# Patient Record
Sex: Male | Born: 1975 | Race: Black or African American | Hispanic: No | Marital: Married | Smoking: Former smoker
Health system: Southern US, Community
[De-identification: ages and names within clinical notes are randomized; demographics above are authoritative.]

## PROBLEM LIST (undated history)

## (undated) DIAGNOSIS — F419 Anxiety disorder, unspecified: Secondary | ICD-10-CM

## (undated) DIAGNOSIS — M25561 Pain in right knee: Secondary | ICD-10-CM

## (undated) DIAGNOSIS — R079 Chest pain, unspecified: Secondary | ICD-10-CM

## (undated) DIAGNOSIS — R002 Palpitations: Secondary | ICD-10-CM

## (undated) DIAGNOSIS — J383 Other diseases of vocal cords: Secondary | ICD-10-CM

## (undated) DIAGNOSIS — Z8679 Personal history of other diseases of the circulatory system: Secondary | ICD-10-CM

## (undated) HISTORY — PX: PATELLA FRACTURE SURGERY: SHX735

## (undated) HISTORY — DX: Chest pain, unspecified: R07.9

## (undated) HISTORY — DX: Palpitations: R00.2

## (undated) HISTORY — DX: Pain in right knee: M25.561

---

## 2017-08-17 ENCOUNTER — Ambulatory Visit: Payer: 59 | Admitting: Family Medicine

## 2017-08-17 ENCOUNTER — Encounter: Payer: Self-pay | Admitting: Family Medicine

## 2017-08-17 ENCOUNTER — Ambulatory Visit (HOSPITAL_BASED_OUTPATIENT_CLINIC_OR_DEPARTMENT_OTHER)
Admission: RE | Admit: 2017-08-17 | Discharge: 2017-08-17 | Disposition: A | Discharge status: Home / Self Care | Payer: 59 | Source: Ambulatory Visit | Attending: Family Medicine | Admitting: Family Medicine

## 2017-08-17 VITALS — BP 157/103 | HR 72 | Ht 72.0 in | Wt 191.0 lb

## 2017-08-17 DIAGNOSIS — M25461 Effusion, right knee: Secondary | ICD-10-CM | POA: Insufficient documentation

## 2017-08-17 DIAGNOSIS — M25561 Pain in right knee: Secondary | ICD-10-CM | POA: Diagnosis not present

## 2017-08-17 NOTE — Patient Instructions (Signed)
You have quad/patellar tendinitis but you also have a seroma from the wire irritating the local soft tissue. Ice area 15 minutes at a time 3-4 times a day as needed. Aleve 2 tabs twice a day with food OR ibuprofen 600mg  three times a day with food for pain and inflammation as needed. Straight leg raises, knee extensions, straight leg raises with foot turned outwards, decline squats 3 sets of 10 once a day. Consider patellar tendon strap. Consider physical therapy. Consider nitro patches if not improving, orthopedic surgery consult.   Follow up with me in 6 weeks.

## 2017-08-21 ENCOUNTER — Encounter: Payer: Self-pay | Admitting: Family Medicine

## 2017-08-21 DIAGNOSIS — S82001A Unspecified fracture of right patella, initial encounter for closed fracture: Secondary | ICD-10-CM | POA: Insufficient documentation

## 2017-08-21 DIAGNOSIS — M25561 Pain in right knee: Secondary | ICD-10-CM

## 2017-08-21 NOTE — Assessment & Plan Note (Signed)
independently reviewed radiographs and fracture has healed, no loosening of wires.  Brief MSK u/s shows a small seroma with part of wire 2mm deep to skin, tender on ultrasound of this area.  No abnormalities of patellar or quad tendons.  No effusion which is reassuring.  Advised we start with conservative treatment for quad, patellar tendinitis.  Icing, aleve or ibuprofen.  Shown home exercises to do daily.  Consider patellar tendon strap, physical therapy, nitro patches.  F/u in 6 weeks.

## 2017-08-21 NOTE — Progress Notes (Signed)
PCP: Patient, No Pcp Per  Subjective:   HPI: Patient is a 42 y.o. male here for right knee pain.  Patient reports in January 2018 he was skating when he went to fast and hit a concrete pillar leading him to fracture his right patella. He had an ORIF with wiring and overall did well following this. More recently he has had some localized swelling and discomfort anterior knee. Feels he also has pain just below and above the kneecap. 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 He does feel tension and some pain up to 3-4 out of 10 level that can be stinging. No new skin changes, numbness. No new injuries.  No past medical history on file.  No current outpatient medications on file prior to visit.   No current facility-administered medications on file prior to visit.     No Known Allergies  Social History   Socioeconomic History  . Marital status: Married    Spouse name: Not on file  . Number of children: Not on file  . Years of education: Not on file  . Highest education level: Not on file  Occupational History  . Not on file  Social Needs  . Financial resource strain: Not on file  . Food insecurity:    Worry: Not on file    Inability: Not on file  . Transportation needs:    Medical: Not on file    Non-medical: Not on file  Tobacco Use  . Smoking status: Never Smoker  . Smokeless tobacco: Never Used  Substance and Sexual Activity  . Alcohol use: Not on file  . Drug use: Not on file  . Sexual activity: Not on file  Lifestyle  . Physical activity:    Days per week: Not on file    Minutes per session: Not on file  . Stress: Not on file  Relationships  . Social connections:    Talks on phone: Not on file    Gets together: Not on file    Attends religious service: Not on file    Active member of club or organization: Not on file    Attends meetings of clubs or organizations: Not on file    Relationship status: Not on file  . Intimate partner  violence:    Fear of current or ex partner: Not on file    Emotionally abused: Not on file    Physically abused: Not on file    Forced sexual activity: Not on file  Other Topics Concern  . Not on file  Social History Narrative  . Not on file    No family history on file.  BP (!) 157/103   Pulse 72   Ht 6' (1.829 m)   Wt 191 lb (86.6 kg)   BMI 25.90 kg/m   Review of Systems: See HPI above.     Objective:  Physical Exam:  Gen: NAD, comfortable in exam room  Right knee: Well healed surgical scar anteriorly over patella.  Small focal mobile mass felt superolaterally over patella with palpable wire distal to this.  No effusion.  No other gross deformity, ecchymoses, swelling. TTP over mass noted above but mild.  Mild TTP patellar and quad tendons.  No joint line, other tenderness. FROM with 5/5 strength, mild pain with extension. Negative ant/post drawers. Negative valgus/varus testing. Negative lachmanns. Negative mcmurrays, apleys, patellar apprehension. NV intact distally.  Left knee: No deformity. FROM with 5/5 strength. No tenderness to palpation.  NVI distally.   Assessment & Plan:  1. Right knee pain - independently reviewed radiographs and fracture has healed, no loosening of wires.  Brief MSK u/s shows a small seroma with part of wire 2mm deep to skin, tender on ultrasound of this area.  No abnormalities of patellar or quad tendons.  No effusion which is reassuring.  Advised we start with conservative treatment for quad, patellar tendinitis.  Icing, aleve or ibuprofen.  Shown home exercises to do daily.  Consider patellar tendon strap, physical therapy, nitro patches.  F/u in 6 weeks.

## 2017-09-28 ENCOUNTER — Encounter: Payer: Self-pay | Admitting: Family Medicine

## 2017-09-28 ENCOUNTER — Ambulatory Visit: Payer: 59 | Admitting: Family Medicine

## 2017-09-28 DIAGNOSIS — M25561 Pain in right knee: Secondary | ICD-10-CM | POA: Diagnosis not present

## 2017-09-28 MED ORDER — NITROGLYCERIN 0.2 MG/HR TD PT24: MEDICATED_PATCH | TRANSDERMAL | 1 refills | Status: DC

## 2017-09-28 NOTE — Patient Instructions (Signed)
You have quad/patellar tendinitis. Ice area 15 minutes at a time 3-4 times a day as needed. Aleve 2 tabs twice a day with food OR ibuprofen  three times a day with food for pain and inflammation as needed. Continue your straight leg raises, knee extensions, straight leg raises with foot turned outwards 3 sets of 10 once a day. Start double leg half-squats now 3 sets of 10 once a day. As tolerated advance to full squats then decline half-squats then decline full squats. Add nitro patches 1/4th patch to affected tendon, change daily. Consider physical therapy. Follow up with me in 6 weeks.

## 2017-10-01 ENCOUNTER — Encounter: Payer: Self-pay | Admitting: Family Medicine

## 2017-10-01 NOTE — Assessment & Plan Note (Signed)
primary issue is quad/patellar tendinitis.  He is improving with home exercises - continue these and reviewed how to advance these with squats to decline squats.  Add nitro patches, change daily.  Consider physical therapy.  Aleve or ibuprofen if needed.  Icing if needed.  F/u in 6 weeks.

## 2017-10-01 NOTE — Progress Notes (Signed)
PCP: Patient, No Pcp Per  Subjective:   HPI: Patient is a 42 y.o. male here for right knee pain.  4/12: Patient reports in January 2018 he was skating when he went to fast and hit a concrete pillar leading him to fracture his right patella. He had an ORIF with wiring and overall did well following this. More recently he has had some localized swelling and discomfort anterior knee. Feels he also has pain just below and above the kneecap. 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 He does feel tension and some pain up to 3-4 out of 10 level that can be stinging. No new skin changes, numbness. No new injuries.  5/24: Patient returns reporting improvement compared to last visit. He's able to do ~115 miles a week on the bike. No pain at his small seroma, only at quad and patellar tendon areas at 2/10 level. Doing home exercises. Not needing regular medication for this. No skin changes.  History reviewed. No pertinent past medical history.  No current outpatient medications on file prior to visit.   No current facility-administered medications on file prior to visit.     No Known Allergies  Social History   Socioeconomic History  . Marital status: Married    Spouse name: Not on file  . Number of children: Not on file  . Years of education: Not on file  . Highest education level: Not on file  Occupational History  . Not on file  Social Needs  . Financial resource strain: Not on file  . Food insecurity:    Worry: Not on file    Inability: Not on file  . Transportation needs:    Medical: Not on file    Non-medical: Not on file  Tobacco Use  . Smoking status: Never Smoker  . Smokeless tobacco: Never Used  Substance and Sexual Activity  . Alcohol use: Not on file  . Drug use: Not on file  . Sexual activity: Not on file  Lifestyle  . Physical activity:    Days per week: Not on file    Minutes per session: Not on file  . Stress: Not on file   Relationships  . Social connections:    Talks on phone: Not on file    Gets together: Not on file    Attends religious service: Not on file    Active member of club or organization: Not on file    Attends meetings of clubs or organizations: Not on file    Relationship status: Not on file  . Intimate partner violence:    Fear of current or ex partner: Not on file    Emotionally abused: Not on file    Physically abused: Not on file    Forced sexual activity: Not on file  Other Topics Concern  . Not on file  Social History Narrative  . Not on file    History reviewed. No pertinent family history.  BP 135/75   Pulse (!) 58   Ht 6' (1.829 m)   Wt 189 lb (85.7 kg)   BMI 25.63 kg/m   Review of Systems: See HPI above.     Objective:  Physical Exam:  Gen: NAD, comfortable in exam room  Right knee: Healed anterior surgical scar over patella.  Small focal mobile mass over patella with palpable wire deep in this but no pain.  No effusion, other abnormalities. Minimal TTP quad and patellar tendons.  No joint line,  other tenderness. FROM with 5/5 strength, minimal pain extension. Negative ant/post drawers. Negative valgus/varus testing. Negative lachmanns. Negative mcmurrays, apleys, patellar apprehension. NV intact distally.   Assessment & Plan:  1. Right knee pain - primary issue is quad/patellar tendinitis.  He is improving with home exercises - continue these and reviewed how to advance these with squats to decline squats.  Add nitro patches, change daily.  Consider physical therapy.  Aleve or ibuprofen if needed.  Icing if needed.  F/u in 6 weeks.

## 2017-11-13 ENCOUNTER — Encounter: Payer: Self-pay | Admitting: Family Medicine

## 2017-11-13 ENCOUNTER — Ambulatory Visit: Payer: 59 | Admitting: Family Medicine

## 2017-11-13 DIAGNOSIS — M25561 Pain in right knee: Secondary | ICD-10-CM | POA: Diagnosis not present

## 2017-11-13 NOTE — Patient Instructions (Signed)
Increase the nitro patches to 1/2 patch and change daily. Ice area 15 minutes at a time 3-4 times a day as needed. Aleve 2 tabs twice a day with food OR ibuprofen 600mg  three times a day with food for pain and inflammation as needed. Try to do the home exercises 5 days a week: straight leg raises, knee extensions, straight leg raises with foot turned outwards 3 sets of 10 once a day. Half-squats 3 sets of 10 once a day. As tolerated advance to full squats then decline half-squats then decline full squats. Consider physical therapy. Follow up with me in 6 weeks to 3 months.

## 2017-11-14 ENCOUNTER — Encounter: Payer: Self-pay | Admitting: Family Medicine

## 2017-11-14 NOTE — Progress Notes (Signed)
PCP: Patient, No Pcp Per  Subjective:   HPI: Patient is a 42 y.o. male here for right knee pain.  4/12: Patient reports in January 2018 he was skating when he went to fast and hit a concrete pillar leading him to fracture his right patella. He had an ORIF with wiring and overall did well following this. More recently he has had some localized swelling and discomfort anterior knee. Feels he also has pain just below and above the kneecap. 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 He does feel tension and some pain up to 3-4 out of 10 level that can be stinging. No new skin changes, numbness. No new injuries.  5/24: Patient returns reporting improvement compared to last visit. He's able to do ~115 miles a week on the bike. No pain at his small seroma, only at quad and patellar tendon areas at 2/10 level. Doing home exercises. Not needing regular medication for this. No skin changes.  7/9: Patient reports he's doing ok. Pain level 3/10 anterior right knee. Has been busier with work. Not doing exercises as frequently. Pain more with extension of knee. Using nitro patches sporadically. Using compression bands. No skin changes.  History reviewed. No pertinent past medical history.  Current Outpatient Medications on File Prior to Visit  Medication Sig Dispense Refill  . nitroGLYCERIN (NITRODUR - DOSED IN MG/24 HR) 0.2 mg/hr patch Apply 1/4th patch to affected knee, change daily 30 patch 1   No current facility-administered medications on file prior to visit.     No Known Allergies  Social History   Socioeconomic History  . Marital status: Married    Spouse name: Not on file  . Number of children: Not on file  . Years of education: Not on file  . Highest education level: Not on file  Occupational History  . Not on file  Social Needs  . Financial resource strain: Not on file  . Food insecurity:    Worry: Not on file    Inability: Not on file  .  Transportation needs:    Medical: Not on file    Non-medical: Not on file  Tobacco Use  . Smoking status: Never Smoker  . Smokeless tobacco: Never Used  Substance and Sexual Activity  . Alcohol use: Not on file  . Drug use: Not on file  . Sexual activity: Not on file  Lifestyle  . Physical activity:    Days per week: Not on file    Minutes per session: Not on file  . Stress: Not on file  Relationships  . Social connections:    Talks on phone: Not on file    Gets together: Not on file    Attends religious service: Not on file    Active member of club or organization: Not on file    Attends meetings of clubs or organizations: Not on file    Relationship status: Not on file  . Intimate partner violence:    Fear of current or ex partner: Not on file    Emotionally abused: Not on file    Physically abused: Not on file    Forced sexual activity: Not on file  Other Topics Concern  . Not on file  Social History Narrative  . Not on file    History reviewed. No pertinent family history.  BP 114/75   Pulse 66   Ht 6' (1.829 m)   Wt 189 lb (85.7 kg)   BMI  25.63 kg/m   Review of Systems: See HPI above.     Objective:  Physical Exam:  Gen: NAD, comfortable in exam room  Right knee: Healed anterior surgical scar over patella.  Small focal mobile mass over patella with palpable wire deep to this but no pain currently.  No effusion, ecchymoses, other deformities. TTP superior aspect of patellar tendon.  No other tenderness. FROM with 5/5 strength without pain. Negative ant/post drawers. Negative valgus/varus testing. Negative lachmanns. Negative mcmurrays, apleys, patellar apprehension. NV intact distally.   Assessment & Plan:  1. Right knee pain - doing well.  Continues with quad and patellar tendinitis.  Not doing exercises or nitro regularly - encouraged to do this.  Aleve or ibuprofen if needed, icing.  Consider physical therapy.  F/u in 6 weeks to 3 months.

## 2017-11-14 NOTE — Assessment & Plan Note (Signed)
doing well.  Continues with quad and patellar tendinitis.  Not doing exercises or nitro regularly - encouraged to do this.  Aleve or ibuprofen if needed, icing.  Consider physical therapy.  F/u in 6 weeks to 3 months.

## 2017-11-23 ENCOUNTER — Other Ambulatory Visit: Payer: Self-pay | Admitting: Family Medicine

## 2018-01-24 ENCOUNTER — Emergency Department (HOSPITAL_BASED_OUTPATIENT_CLINIC_OR_DEPARTMENT_OTHER)
Admission: EM | Admit: 2018-01-24 | Discharge: 2018-01-24 | Disposition: A | Discharge status: Home / Self Care | Payer: 59 | Attending: Emergency Medicine | Admitting: Emergency Medicine

## 2018-01-24 ENCOUNTER — Emergency Department (HOSPITAL_BASED_OUTPATIENT_CLINIC_OR_DEPARTMENT_OTHER): Payer: 59

## 2018-01-24 ENCOUNTER — Encounter (HOSPITAL_BASED_OUTPATIENT_CLINIC_OR_DEPARTMENT_OTHER): Payer: Self-pay

## 2018-01-24 ENCOUNTER — Other Ambulatory Visit: Payer: Self-pay

## 2018-01-24 DIAGNOSIS — R002 Palpitations: Secondary | ICD-10-CM | POA: Diagnosis present

## 2018-01-24 LAB — CBC WITH DIFFERENTIAL/PLATELET
Basophils Absolute: 0 10*3/uL (ref 0.0–0.1)
Basophils Relative: 0 %
EOS ABS: 0.2 10*3/uL (ref 0.0–0.7)
EOS PCT: 2 %
HCT: 43.5 % (ref 39.0–52.0)
HEMOGLOBIN: 14.6 g/dL (ref 13.0–17.0)
LYMPHS ABS: 1.7 10*3/uL (ref 0.7–4.0)
LYMPHS PCT: 27 %
MCH: 28.1 pg (ref 26.0–34.0)
MCHC: 33.6 g/dL (ref 30.0–36.0)
MCV: 83.8 fL (ref 78.0–100.0)
MONOS PCT: 8 %
Monocytes Absolute: 0.5 10*3/uL (ref 0.1–1.0)
Neutro Abs: 3.9 10*3/uL (ref 1.7–7.7)
Neutrophils Relative %: 63 %
PLATELETS: 236 10*3/uL (ref 150–400)
RBC: 5.19 MIL/uL (ref 4.22–5.81)
RDW: 13 % (ref 11.5–15.5)
WBC: 6.2 10*3/uL (ref 4.0–10.5)

## 2018-01-24 LAB — BASIC METABOLIC PANEL
Anion gap: 11 (ref 5–15)
BUN: 12 mg/dL (ref 6–20)
CHLORIDE: 107 mmol/L (ref 98–111)
CO2: 25 mmol/L (ref 22–32)
CREATININE: 1.04 mg/dL (ref 0.61–1.24)
Calcium: 9 mg/dL (ref 8.9–10.3)
GFR calc Af Amer: >60 mL/min (ref >60)
GFR calc non Af Amer: >60 mL/min (ref >60)
Glucose, Bld: 108 mg/dL — ABNORMAL HIGH (ref 70–99)
Potassium: 3.5 mmol/L (ref 3.5–5.1)
SODIUM: 143 mmol/L (ref 135–145)

## 2018-01-24 LAB — TROPONIN I

## 2018-01-24 NOTE — ED Provider Notes (Addendum)
[facility] EMERGENCY DEPARTMENT Provider Note   CSN: [id number] Arrival date & time: 01/24/18  1651     History   Chief Complaint Chief Complaint  Patient presents with  . Palpitations    HPI Brandon [name] is a 42 y.o. male.  HPI   Patient is a 42 year old male with history past medical history presents emergency department today for evaluation of palpitations that have been ongoing intimately for the last several days.  Patient states he first experienced an episode of palpitations while at the beach.  Episode lasted for about 20 seconds and resolved on its own.  Experienced another episode of palpitations and was at work today.  With this episode he became short of breath and somewhat lightheaded.  He denied ever having any chest pain, nausea, diaphoresis or dizziness with this episode.  Episode resolved on its own after several seconds of slurred breathing.  He is currently asymptomatic.  States he has never had symptoms like this before.  Reports high stress from his job that led him to start smoking tobacco 1 month ago.  Also states that he smokes marijuana with a vape.  Denies any drug use.  Denies drinking any energy drinks.  Drinks about a cup and a half coffee daily.  Denies leg pain/swelling, hemoptysis, recent surgery/trauma, recent long travel, hormone use, personal hx of cancer, or hx of DVT/PE.   History reviewed. No pertinent past medical history.  Patient Active Problem List   Diagnosis Date Noted  . Right knee pain 08/21/2017    Past Surgical History:  Procedure Laterality Date  . JOINT REPLACEMENT          Home Medications    Prior to Admission medications   Medication Sig Start Date End Date Taking? Authorizing Provider  nitroGLYCERIN (NITRODUR - DOSED IN MG/24 HR) 0.2 mg/hr patch APPLY 1/4TH PATCH TO AFFECTED KNEE, CHANGE DAILY 11/23/17   Hudnall, Azucena Fallen, MD    Family History No family history on file.  Social History Social History    Tobacco Use  . Smoking status: Never Smoker  . Smokeless tobacco: Never Used  Substance Use Topics  . Alcohol use: Not on file  . Drug use: Not on file     Allergies   Patient has no known allergies.   Review of Systems Review of Systems  Constitutional: Negative for chills and fever.  HENT: Negative for ear pain and sore throat.   Eyes: Negative for pain and visual disturbance.  Respiratory: Positive for shortness of breath (resolved). Negative for cough.   Cardiovascular: Positive for palpitations. Negative for chest pain.  Gastrointestinal: Negative for abdominal pain, constipation, diarrhea, nausea and vomiting.  Genitourinary: Negative for dysuria and hematuria.  Musculoskeletal: Negative for back pain.  Skin: Negative for rash.  Neurological: Negative for headaches.  All other systems reviewed and are negative.    Physical Exam Updated Vital Signs BP 128/77   Pulse 64   Temp 98 F (36.7 C) (Oral)   Resp 18   Ht 6' (1.829 m)   Wt 85 kg   SpO2 98%   BMI 25.41 kg/m   Physical Exam  Constitutional: He appears well-developed and well-nourished. No distress.  HENT:  Head: Normocephalic and atraumatic.  Eyes: Conjunctivae are normal.  Neck: Neck supple.  Cardiovascular: Normal rate, regular rhythm and normal heart sounds.  No murmur heard. Pulmonary/Chest: Effort normal and breath sounds normal. No stridor. No respiratory distress. He has no wheezes.  Abdominal: Soft. Bowel sounds  are normal. He exhibits no distension. There is no tenderness. There is no guarding.  Musculoskeletal:  No calf TTP, erythema, swelling.  Neurological: He is alert.  Skin: Skin is warm and dry. Capillary refill takes less than 2 seconds.  Psychiatric: He has a normal mood and affect.  Nursing note and vitals reviewed.  ED Treatments / Results  Labs (all labs ordered are listed, but only abnormal results are displayed) Labs Reviewed  BASIC METABOLIC PANEL - Abnormal; Notable  for the following components:      Result Value   Glucose, Bld 108 (*)    All other components within normal limits  CBC WITH DIFFERENTIAL/PLATELET  TROPONIN I    EKG EKG Interpretation  Date/Time:  Thursday January 24 2018 17:08:04 EDT Ventricular Rate:  69 PR Interval:    QRS Duration: 90 QT Interval:  387 QTC Calculation: 415 R Axis:   40 Text Interpretation:  Sinus rhythm Anteroseptal infarct, old No old tracing to compare Confirmed by Melene Plan [phone]) on 01/24/2018 5:15:02 PM   Radiology Dg Chest 2 View  Result Date: 01/24/2018 CLINICAL DATA:  Intermittent rapid heart rate with lightheadedness EXAM: CHEST - 2 VIEW COMPARISON:  None. FINDINGS: The heart size and mediastinal contours are within normal limits. Both lungs are clear. The visualized skeletal structures are unremarkable. IMPRESSION: No active cardiopulmonary disease. Electronically Signed   By: Jasmine Pang M.D.   On: 01/24/2018 17:29    Procedures Procedures (including critical care time)  Medications Ordered in ED Medications - No data to display   Initial Impression / Assessment and Plan / ED Course  I have reviewed the triage vital signs and the nursing notes.  Pertinent labs & imaging results that were available during my care of the patient were reviewed by me and considered in my medical decision making (see chart for details).     Final Clinical Impressions(s) / ED Diagnoses   Final diagnoses:  Palpitations   Patient presenting with palpitations that have been intermittent over the last week.  Last episode occurred earlier today while he was at work.  Episodes last about 20 seconds at a time and resolve on their own.  Today episode was associated with shortness of breath and some lightheadedness.  Symptoms have completely resolved on arrival to the ED today.  No shortness of breath, chest pain, lightheadedness.  Denies chest pain with any of the episodes.  Labs are reassuring. Electrolytes  are WNL. Troponin is negative. CXR is negative. EKG is without arrhythmia or signs of ischemia. NSR.  Suspect the patient may be having PVCs, may also be having runs of SVT or other arrhythmia.   Patient is PERC negative.  No evidence of fluid overload on exam, low suspicion for heart failure as a cause of his palpitations.  Also have lower suspicion for ACS as his symptoms are atypical, his HEART score is 1 and he is low risk for this.  Doubt other emergent etiology of his symptoms at this time.  He does not currently have a PCP so we will give him a referral to cardiology so that he can follow-up for further work-up or evaluation.  I discussed signs and symptoms that would require him to return to the ER immediately.  He voiced understanding of the plan and reasons to return to ED.  All questions answered.  ED Discharge Orders    None       Rayne Du 01/24/18 1830    Nikolai Wilczak  S, PA-C 01/24/18 1831    Melene PlanFloyd, Dan, DO 01/24/18 2117

## 2018-01-24 NOTE — ED Notes (Signed)
Pt had to run to his car, EKG and triage delayed

## 2018-01-24 NOTE — ED Triage Notes (Signed)
Pt c/o intermittent rapid heart rate with palpitations for the last few days with lightheadedness, denies chest pain, c/o mild SOB with the palpitations

## 2018-01-24 NOTE — ED Notes (Signed)
NAD at this time. Pt is stable and going home.  

## 2018-01-24 NOTE — Discharge Instructions (Addendum)
You were given a referral to the cardiology office.  Please call the office to make an appointment for follow-up.  Please return to the ER for any chest pain, shortness of breath, persistent palpitations, palpitations associated with exertion, lightheadedness or any new or worsening symptoms.

## 2018-01-28 DIAGNOSIS — R079 Chest pain, unspecified: Secondary | ICD-10-CM | POA: Insufficient documentation

## 2018-02-03 ENCOUNTER — Emergency Department (HOSPITAL_BASED_OUTPATIENT_CLINIC_OR_DEPARTMENT_OTHER)
Admission: EM | Admit: 2018-02-03 | Discharge: 2018-02-03 | Disposition: A | Discharge status: Home / Self Care | Payer: 59 | Attending: Emergency Medicine | Admitting: Emergency Medicine

## 2018-02-03 ENCOUNTER — Other Ambulatory Visit: Payer: Self-pay

## 2018-02-03 ENCOUNTER — Encounter (HOSPITAL_BASED_OUTPATIENT_CLINIC_OR_DEPARTMENT_OTHER): Payer: Self-pay | Admitting: Emergency Medicine

## 2018-02-03 DIAGNOSIS — I471 Supraventricular tachycardia: Secondary | ICD-10-CM | POA: Diagnosis not present

## 2018-02-03 DIAGNOSIS — R002 Palpitations: Secondary | ICD-10-CM | POA: Diagnosis present

## 2018-02-03 LAB — CBC
HCT: 47.1 % (ref 39.0–52.0)
Hemoglobin: 15.9 g/dL (ref 13.0–17.0)
MCH: 28 pg (ref 26.0–34.0)
MCHC: 33.8 g/dL (ref 30.0–36.0)
MCV: 82.9 fL (ref 78.0–100.0)
PLATELETS: 207 10*3/uL (ref 150–400)
RBC: 5.68 MIL/uL (ref 4.22–5.81)
RDW: 12.9 % (ref 11.5–15.5)
WBC: 6.4 10*3/uL (ref 4.0–10.5)

## 2018-02-03 LAB — BASIC METABOLIC PANEL
Anion gap: 9 (ref 5–15)
BUN: 14 mg/dL (ref 6–20)
CALCIUM: 9.2 mg/dL (ref 8.9–10.3)
CO2: 27 mmol/L (ref 22–32)
CREATININE: 1.1 mg/dL (ref 0.61–1.24)
Chloride: 102 mmol/L (ref 98–111)
Glucose, Bld: 100 mg/dL — ABNORMAL HIGH (ref 70–99)
Potassium: 3.7 mmol/L (ref 3.5–5.1)
Sodium: 138 mmol/L (ref 135–145)

## 2018-02-03 LAB — TSH: TSH: 0.745 u[IU]/mL (ref 0.350–4.500)

## 2018-02-03 MED ORDER — SODIUM CHLORIDE 0.9 % IV BOLUS
500.0000 mL | Freq: Once | INTRAVENOUS | Status: AC
  Administered 2018-02-03: 500 mL via INTRAVENOUS

## 2018-02-03 MED ORDER — METOPROLOL TARTRATE 50 MG PO TABS: 50.0000 mg | ORAL_TABLET | Freq: Two times a day (BID) | ORAL | 0 refills | Status: DC

## 2018-02-03 MED ORDER — METOPROLOL TARTRATE 5 MG/5ML IV SOLN
5.0000 mg | Freq: Once | INTRAVENOUS | Status: AC
  Administered 2018-02-03: 5 mg via INTRAVENOUS
  Filled 2018-02-03: qty 5

## 2018-02-03 NOTE — ED Notes (Signed)
While triaging the patient he had a run of HR up to 156 and then decreased back down to to 60 -80

## 2018-02-03 NOTE — Discharge Instructions (Signed)
Decrease your caffeine intake  Keep yourself hydrated  Follow-up with the cardiologist on Friday as scheduled  Please take the medication as prescribed.  You may take 1 extra dose if you begin to feel palpitations that do not resolve on their own.

## 2018-02-03 NOTE — ED Triage Notes (Signed)
Patient states that he has had palpitations since the 16th  - he is waiting ot go to the cardiologist. The patient reports that he states that started to get dizzy while driving. Patient has irregular heart rhythm

## 2018-02-03 NOTE — ED Notes (Signed)
Pt on cardiac monitor and auto VS 

## 2018-02-03 NOTE — Progress Notes (Signed)
Referring-Dan Adela Lank DO Reason for referral-Palpitations  HPI: 42 yo male for evaluation of palpitations at request of Melene Plan DO. Chest xray 9/19 negative; troponin normal, TSH 0.745, Hgb 15.9 and K 3.7.  No prior cardiac history.  Typically does not have dyspnea on exertion, orthopnea, PND, pedal edema, chest pain or syncope.  In [place] while [place] the beach he had his first episode of palpitations.  They are sudden in onset and resolve spontaneously.  Mild dizziness but no syncope or chest pain.  Mild sensation of dyspnea.  His symptoms were recurrent and he was seen in the emergency room.  An electrocardiogram was captured at the time of his symptoms and showed probable atrial flutter which converted spontaneously.  He was placed on metoprolol and has had no further rhythm disturbances.  Cardiology asked to evaluate.  Current Outpatient Medications  Medication Sig Dispense Refill  . metoprolol tartrate (LOPRESSOR) 50 MG tablet Take 1 tablet (50 mg total) by mouth 2 (two) times daily. 60 tablet 0  . nitroGLYCERIN (NITRODUR - DOSED IN MG/24 HR) 0.2 mg/hr patch APPLY 1/4TH PATCH TO AFFECTED KNEE, CHANGE DAILY 30 patch 1   No current facility-administered medications for this visit.     No Known Allergies   Past Medical History:  Diagnosis Date  . Chest pain 01/28/2018  . Palpitation   . Right knee pain     Past Surgical History:  Procedure Laterality Date  . PATELLA FRACTURE SURGERY      Social History   Socioeconomic History  . Marital status: Married    Spouse name: Not on file  . Number of children: 2  . Years of education: Not on file  . Highest education level: Not on file  Occupational History  . Not on file  Social Needs  . Financial resource strain: Not on file  . Food insecurity:    Worry: Not on file    Inability: Not on file  . Transportation needs:    Medical: Not on file    Non-medical: Not on file  Tobacco Use  . Smoking status: Current Every Day  Smoker  . Smokeless tobacco: Never Used  Substance and Sexual Activity  . Alcohol use: Yes    Comment: Rare  . Drug use: Yes    Types: Marijuana  . Sexual activity: Not on file  Lifestyle  . Physical activity:    Days per week: Not on file    Minutes per session: Not on file  . Stress: Not on file  Relationships  . Social connections:    Talks on phone: Not on file    Gets together: Not on file    Attends religious service: Not on file    Active member of club or organization: Not on file    Attends meetings of clubs or organizations: Not on file    Relationship status: Not on file  . Intimate partner violence:    Fear of current or ex partner: Not on file    Emotionally abused: Not on file    Physically abused: Not on file    Forced sexual activity: Not on file  Other Topics Concern  . Not on file  Social History Narrative  . Not on file    Family History  Problem Relation Age of Onset  . Cancer Mother        ESOPHAGEAL  . Diabetes Father   . Heart Problems Father        CARDIOMEGALY  .  Healthy Brother     ROS: no fevers or chills, productive cough, hemoptysis, dysphasia, odynophagia, melena, hematochezia, dysuria, hematuria, rash, seizure activity, orthopnea, PND, pedal edema, claudication. Remaining systems are negative.  Physical Exam:   Blood pressure 115/75, pulse 64, height 6' (1.829 m), weight 187 lb (84.8 kg).  General:  Well developed/well nourished in NAD Skin warm/dry Patient not depressed No peripheral clubbing Back-normal HEENT-normal/normal eyelids Neck supple/normal carotid upstroke bilaterally; no bruits; no JVD; no thyromegaly chest - CTA/ normal expansion CV - RRR/normal S1 and S2; no murmurs, rubs or gallops;  PMI nondisplaced Abdomen -NT/ND, no HSM, no mass, + bowel sounds, no bruit 2+ femoral pulses, no bruits Ext-no edema, chords, 2+ DP Neuro-grossly nonfocal  ECG -February 03, 2018-sinus rhythm, early repolarization abnormality,  cannot rule out septal infarct.  Personally reviewed  Electrocardiogram earlier in the day showed atrial flutter converting to sinus rhythm.  A/P  1 atrial flutter versus ectopic atrial tachycardia-I have reviewed patient's previous ECGs.  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  Continue metoprolol for rate control if atrial arrhythmias recur.  Recent TSH normal.  Schedule echocardiogram to further assess.  I will ask electrophysiology to review to see if this is an ablative will rhythm. CHADSvasc 0.  We will not anticoagulate.  2 tobacco abuse-patient counseled on discontinuing.  Olga Millers, MD

## 2018-02-03 NOTE — ED Notes (Signed)
ED Provider at bedside. 

## 2018-02-04 NOTE — ED Provider Notes (Signed)
[facility] EMERGENCY DEPARTMENT Provider Note   CSN: [id number] Arrival date & time: 02/03/18  1139     History   Chief Complaint Chief Complaint  Patient presents with  . Palpitations    HPI Brandon [name] is a 42 y.o. male.  HPI 42 year old male presents the emergency department with intermittent heart palpitations since 16 September.  He is scheduled to see the cardiologist this Friday.  Today became dizzy and felt the palpitations began.  He presented to the ER for further evaluation.  No syncope.  No significant chest pain.  On arrival the emergency department he was initially with a heart rate in the 70s when he suddenly developed a heart rate up into the 150s.  This was recorded on EKG and appeared to be a narrow complex regular tachycardia consistent with likely SVT.  Tachycardia and arrhythmia terminated on its own without involvement of the emergency department staff.  He reports that he drinks coffee in the morning but does not drink in excess amount of caffeine throughout the day or into the evening.  He denies recent cough or cold medicines.  He was recently seen for the same symptoms and no arrhythmia was found.  He was sent to cardiology at that time and he has an upcoming appointment.  He has not had his thyroid levels checked.  Currently asymptomatic   Past Medical History:  Diagnosis Date  . Chest pain 01/28/2018  . Palpitation   . Right knee pain     Patient Active Problem List   Diagnosis Date Noted  . Chest pain 01/28/2018  . Right knee pain 08/21/2017    Past Surgical History:  Procedure Laterality Date  . JOINT REPLACEMENT          Home Medications    Prior to Admission medications   Medication Sig Start Date End Date Taking? Authorizing Provider  metoprolol tartrate (LOPRESSOR) 50 MG tablet Take 1 tablet (50 mg total) by mouth 2 (two) times daily. 02/03/18   Azalia Bilis, MD  nitroGLYCERIN (NITRODUR - DOSED IN MG/24 HR) 0.2 mg/hr patch  APPLY 1/4TH PATCH TO AFFECTED KNEE, CHANGE DAILY 11/23/17   Hudnall, Azucena Fallen, MD    Family History Family History  Problem Relation Age of Onset  . Cancer Mother        ESOPHAGEAL  . Diabetes Father   . Heart Problems Father        CARDIOMEGALY  . Healthy Brother     Social History Social History   Tobacco Use  . Smoking status: Never Smoker  . Smokeless tobacco: Never Used  Substance Use Topics  . Alcohol use: Not on file  . Drug use: Not on file     Allergies   Patient has no known allergies.   Review of Systems Review of Systems  All other systems reviewed and are negative.    Physical Exam Updated Vital Signs BP (!) 118/94   Pulse 62   Temp 98.5 F (36.9 C) (Oral)   Resp 18   Ht 6' (1.829 m)   Wt 83 kg   SpO2 100%   BMI 24.82 kg/m   Physical Exam  Constitutional: He is oriented to person, place, and time. He appears well-developed and well-nourished.  HENT:  Head: Normocephalic and atraumatic.  Eyes: EOM are normal.  Neck: Normal range of motion.  Cardiovascular: Normal rate, regular rhythm, normal heart sounds and intact distal pulses.  Pulmonary/Chest: Effort normal and breath sounds normal. No respiratory distress.  Abdominal: Soft. He exhibits no distension. There is no tenderness.  Musculoskeletal: Normal range of motion.  Neurological: He is alert and oriented to person, place, and time.  Skin: Skin is warm and dry.  Psychiatric: He has a normal mood and affect. Judgment normal.  Nursing note and vitals reviewed.    ED Treatments / Results  Labs (all labs ordered are listed, but only abnormal results are displayed) Labs Reviewed  BASIC METABOLIC PANEL - Abnormal; Notable for the following components:      Result Value   Glucose, Bld 100 (*)    All other components within normal limits  CBC  TSH    EKG EKG Interpretation  Date/Time:  "Sunday February 03 2018 11:50:44 EDT Ventricular Rate:  147 PR Interval:  132 QRS  Duration: 72 QT Interval:  290 QTC Calculation: 453 R Axis:   69 Text Interpretation:  Atrial fibrillation with rapid ventricular response Septal infarct , age undetermined Abnormal ECG Confirmed by Belfi, Melanie ([id number]) on 02/04/2018 10:47:06 AM   Radiology No results found.  Procedures Procedures (including critical care time)  Medications Ordered in ED Medications  metoprolol tartrate (LOPRESSOR) injection 5 mg (5 mg Intravenous Given 02/03/18 1248)  sodium chloride 0.9 % bolus 500 mL (0 mLs Intravenous Stopped 02/03/18 1318)     Initial Impression / Assessment and Plan / ED Course  I have reviewed the triage vital signs and the nursing notes.  Pertinent labs & imaging results that were available during my care of the patient were reviewed by me and considered in my medical decision making (see chart for details).    Patient observed in the emergency department.  No further arrhythmia/tachycardia.  Given 5 mg IV Lopressor.  Patient be discharged home on metoprolol.  Will benefit from outpatient cardiology follow-up and echocardiogram.  Cardiology will be able to see today's EKG and can further advise.  In the meantime of asked that the patient decrease his caffeine intake.  Thyroid levels are pending.  Final Clinical Impressions(s) / ED Diagnoses   Final diagnoses:  SVT (supraventricular tachycardia) (HCC)    ED Discharge Orders         Ordered    metoprolol tartrate (LOPRESSOR) 50 MG tablet  2 times daily,   Status:  Discontinued     02/03/18 1309    metoprolol tartrate (LOPRESSOR) 50 MG tablet  2 times daily     09" /29/19 1310           Azalia Bilis, MD 02/04/18 1307

## 2018-02-05 ENCOUNTER — Ambulatory Visit: Payer: 59 | Admitting: Family Medicine

## 2018-02-08 ENCOUNTER — Ambulatory Visit: Payer: 59 | Admitting: Cardiology

## 2018-02-08 ENCOUNTER — Other Ambulatory Visit: Payer: Self-pay

## 2018-02-08 ENCOUNTER — Ambulatory Visit (HOSPITAL_COMMUNITY): Payer: 59 | Attending: Cardiology

## 2018-02-08 ENCOUNTER — Encounter: Payer: Self-pay | Admitting: Cardiology

## 2018-02-08 VITALS — BP 115/75 | HR 64 | Ht 72.0 in | Wt 187.0 lb

## 2018-02-08 DIAGNOSIS — I484 Atypical atrial flutter: Secondary | ICD-10-CM

## 2018-02-08 DIAGNOSIS — R002 Palpitations: Secondary | ICD-10-CM | POA: Insufficient documentation

## 2018-02-08 DIAGNOSIS — R079 Chest pain, unspecified: Secondary | ICD-10-CM | POA: Insufficient documentation

## 2018-02-08 DIAGNOSIS — Z720 Tobacco use: Secondary | ICD-10-CM

## 2018-02-08 LAB — ECHOCARDIOGRAM COMPLETE
HEIGHTINCHES: 72 in
Weight: 2992 oz

## 2018-02-08 NOTE — Patient Instructions (Signed)
Medication Instructions:  NO CHANGE If you need a refill on your cardiac medications before your next appointment, please call your pharmacy.   Lab work: If you have labs (blood work) drawn today and your tests are completely normal, you will receive your results only by: Marland Kitchen MyChart Message (if you have MyChart) OR . A paper copy in the mail If you have any lab test that is abnormal or we need to change your treatment, we will call you to review the results.  Testing/Procedures: Your physician has requested that you have an echocardiogram. Echocardiography is a painless test that uses sound waves to create images of your heart. It provides your doctor with information about the size and shape of your heart and how well your heart's chambers and valves are working. This procedure takes approximately one hour. There are no restrictions for this procedure.    Follow-Up: At [facility], you and your health needs are our priority.  As part of our continuing mission to provide you with exceptional heart care, we have created designated Provider Care Teams.  These Care Teams include your primary Cardiologist (physician) and Advanced Practice Providers (APPs -  Physician Assistants and Nurse Practitioners) who all work together to provide you with the care you need, when you need it. You will need a follow up appointment in 4 months.    You may see DR Jens Som or one of the following Advanced Practice Providers on your designated Care Team:   Corine Shelter, PA-C Judy Pimple, [place] . Callie Goodrich, PA-C  REFERRAL TO DR Sharlot Gowda Ladona Ridgel TO DISCUSS ATRIAL FLUTTER ABLATION

## 2018-02-24 ENCOUNTER — Emergency Department (HOSPITAL_BASED_OUTPATIENT_CLINIC_OR_DEPARTMENT_OTHER)
Admission: EM | Admit: 2018-02-24 | Discharge: 2018-02-24 | Disposition: A | Discharge status: Home / Self Care | Payer: 59 | Attending: Emergency Medicine | Admitting: Emergency Medicine

## 2018-02-24 ENCOUNTER — Other Ambulatory Visit: Payer: Self-pay

## 2018-02-24 ENCOUNTER — Encounter (HOSPITAL_BASED_OUTPATIENT_CLINIC_OR_DEPARTMENT_OTHER): Payer: Self-pay | Admitting: *Deleted

## 2018-02-24 DIAGNOSIS — F17210 Nicotine dependence, cigarettes, uncomplicated: Secondary | ICD-10-CM | POA: Insufficient documentation

## 2018-02-24 DIAGNOSIS — R21 Rash and other nonspecific skin eruption: Secondary | ICD-10-CM | POA: Diagnosis not present

## 2018-02-24 DIAGNOSIS — Z79899 Other long term (current) drug therapy: Secondary | ICD-10-CM | POA: Diagnosis not present

## 2018-02-24 MED ORDER — KETOCONAZOLE 2 % EX CREA: 1.0000 "application " | TOPICAL_CREAM | Freq: Two times a day (BID) | CUTANEOUS | 0 refills | Status: DC

## 2018-02-24 NOTE — Discharge Instructions (Addendum)
Apply ketoconazole twice a day for 2 weeks Switch to hydrocortisone 1% (OTC) if not improving after that

## 2018-02-24 NOTE — ED Triage Notes (Signed)
Pt reports he noticed a rash on his right arm today. ?ringworm

## 2018-02-24 NOTE — ED Provider Notes (Addendum)
[facility] EMERGENCY DEPARTMENT Provider Note   CSN: [id number] Arrival date & time: 02/24/18  1958     History   Chief Complaint Chief Complaint  Patient presents with  . Rash    HPI Brandon [name] is a 42 y.o. male who presents with a rash. No significant PMH. He states that he noticed it today on his right posterior forearm. He is self-employed and works as an TEFL teacher. He doesn't have any pets. No new soaps, lotions, detergents. It is itchy. There isn't any other rashes on his body. He would have gone to get something OTC but wasn't sure what to get so he came here.  HPI  Past Medical History:  Diagnosis Date  . Chest pain 01/28/2018  . Palpitation   . Right knee pain     Patient Active Problem List   Diagnosis Date Noted  . Chest pain 01/28/2018  . Right knee pain 08/21/2017    Past Surgical History:  Procedure Laterality Date  . PATELLA FRACTURE SURGERY          Home Medications    Prior to Admission medications   Medication Sig Start Date End Date Taking? Authorizing Provider  metoprolol tartrate (LOPRESSOR) 50 MG tablet Take 1 tablet (50 mg total) by mouth 2 (two) times daily. 02/03/18   Azalia Bilis, MD  nitroGLYCERIN (NITRODUR - DOSED IN MG/24 HR) 0.2 mg/hr patch APPLY 1/4TH PATCH TO AFFECTED KNEE, CHANGE DAILY 11/23/17   Hudnall, Azucena Fallen, MD    Family History Family History  Problem Relation Age of Onset  . Cancer Mother        ESOPHAGEAL  . Diabetes Father   . Heart Problems Father        CARDIOMEGALY  . Healthy Brother     Social History Social History   Tobacco Use  . Smoking status: Current Every Day Smoker    Types: Cigarettes  . Smokeless tobacco: Never Used  Substance Use Topics  . Alcohol use: Yes    Comment: Rare  . Drug use: Yes    Types: Marijuana     Allergies   Patient has no known allergies.   Review of Systems Review of Systems  Constitutional: Negative for fever.  Skin: Positive for rash.      Physical Exam Updated Vital Signs BP 116/72 (BP Location: Left Arm)   Pulse 64   Temp 98.4 F (36.9 C) (Oral)   Resp 18   Ht 6' (1.829 m)   Wt 80.3 kg   SpO2 100%   BMI 24.01 kg/m   Physical Exam  Constitutional: He is oriented to person, place, and time. He appears well-developed and well-nourished. No distress.  HENT:  Head: Normocephalic and atraumatic.  Eyes: Pupils are equal, round, and reactive to light. Conjunctivae are normal. Right eye exhibits no discharge. Left eye exhibits no discharge. No scleral icterus.  Neck: Normal range of motion.  Cardiovascular: Normal rate.  Pulmonary/Chest: Effort normal. No respiratory distress.  Abdominal: He exhibits no distension.  Neurological: He is alert and oriented to person, place, and time.  Skin: Skin is warm and dry. Rash (Quarter sized erythematous rash with central clearing on the right posterior forearm ) noted.  Psychiatric: He has a normal mood and affect. His behavior is normal.  Nursing note and vitals reviewed.   ED Treatments / Results  Labs (all labs ordered are listed, but only abnormal results are displayed) Labs Reviewed - No data to display  EKG None  Radiology No results found.  Procedures Procedures (including critical care time)  Medications Ordered in ED Medications - No data to display   Initial Impression / Assessment and Plan / ED Course  I have reviewed the triage vital signs and the nursing notes.  Pertinent labs & imaging results that were available during my care of the patient were reviewed by me and considered in my medical decision making (see chart for details).  42 year old male with rash. Likely ringworm. Rx for ketoconazole was given. Advised use hydrocortisone if not improving.  Final Clinical Impressions(s) / ED Diagnoses   Final diagnoses:  Rash and nonspecific skin eruption    ED Discharge Orders    None       Bethel Born, PA-C 02/24/18 2104     Bethel Born, PA-C 02/24/18 2105    Terrilee Files, MD 02/24/18 2320

## 2018-03-03 ENCOUNTER — Other Ambulatory Visit: Payer: Self-pay

## 2018-03-03 ENCOUNTER — Encounter (HOSPITAL_BASED_OUTPATIENT_CLINIC_OR_DEPARTMENT_OTHER): Payer: Self-pay | Admitting: Emergency Medicine

## 2018-03-03 ENCOUNTER — Emergency Department (HOSPITAL_BASED_OUTPATIENT_CLINIC_OR_DEPARTMENT_OTHER)
Admission: EM | Admit: 2018-03-03 | Discharge: 2018-03-03 | Disposition: A | Discharge status: Home / Self Care | Payer: 59 | Attending: Emergency Medicine | Admitting: Emergency Medicine

## 2018-03-03 DIAGNOSIS — L0100 Impetigo, unspecified: Secondary | ICD-10-CM | POA: Insufficient documentation

## 2018-03-03 DIAGNOSIS — R21 Rash and other nonspecific skin eruption: Secondary | ICD-10-CM | POA: Diagnosis present

## 2018-03-03 DIAGNOSIS — B354 Tinea corporis: Secondary | ICD-10-CM | POA: Insufficient documentation

## 2018-03-03 DIAGNOSIS — F17210 Nicotine dependence, cigarettes, uncomplicated: Secondary | ICD-10-CM | POA: Insufficient documentation

## 2018-03-03 MED ORDER — TERBINAFINE HCL 250 MG PO TABS: 250.0000 mg | ORAL_TABLET | Freq: Every day | ORAL | 0 refills | Status: AC

## 2018-03-03 MED ORDER — CEPHALEXIN 500 MG PO CAPS: 500.0000 mg | ORAL_CAPSULE | Freq: Three times a day (TID) | ORAL | 0 refills | Status: AC

## 2018-03-03 NOTE — Discharge Instructions (Signed)
It appears as if you have developed a bacterial skin infection. I am starting you on antibiotics. Some of the underlying lesions do also appear to be ringworm. I am starting your on oral medication for this as well. You can stop the topical medication. Call your PCP in the coming week to schedule a follow up appointment.

## 2018-03-03 NOTE — ED Provider Notes (Signed)
Emergency Department Provider Note   I have reviewed the triage vital signs and the nursing notes.   HISTORY  Chief Complaint Rash   HPI Brandon [name] is a 42 y.o. male presents to the ED with worsening right forearm rash. He reports change in rash with some blistering and now drainage. He was initially diagnosed with ringworm and started on topical anti-fungals and topical steroids. Over the last few days the rash has caused arm swelling and developed a crusty, yellow drainage. Denies fever. No chills. No rash in any other location.   Past Medical History:  Diagnosis Date  . Chest pain 01/28/2018  . Palpitation   . Right knee pain     Patient Active Problem List   Diagnosis Date Noted  . Chest pain 01/28/2018  . Right knee pain 08/21/2017    Past Surgical History:  Procedure Laterality Date  . PATELLA FRACTURE SURGERY      Allergies Patient has no known allergies.  Family History  Problem Relation Age of Onset  . Cancer Mother        ESOPHAGEAL  . Diabetes Father   . Heart Problems Father        CARDIOMEGALY  . Healthy Brother     Social History Social History   Tobacco Use  . Smoking status: Current Every Day Smoker    Types: Cigarettes  . Smokeless tobacco: Never Used  Substance Use Topics  . Alcohol use: Yes    Comment: Rare  . Drug use: Yes    Types: Marijuana    Review of Systems  Constitutional: No fever/chills Eyes: No visual changes. ENT: No sore throat. Cardiovascular: Denies chest pain. Respiratory: Denies shortness of breath. Gastrointestinal: No abdominal pain.  No nausea, no vomiting.  No diarrhea.  No constipation. Genitourinary: Negative for dysuria. Musculoskeletal: Negative for back pain. Skin: Positive right forearm rash.  Neurological: Negative for headaches, focal weakness or numbness.  10-point ROS otherwise negative.  ____________________________________________   PHYSICAL EXAM:  VITAL SIGNS: ED Triage Vitals    Enc Vitals Group     BP 03/03/18 1221 126/90     Pulse Rate 03/03/18 1221 69     Resp 03/03/18 1221 14     Temp 03/03/18 1221 98 F (36.7 C)     Temp Source 03/03/18 1221 Oral     SpO2 03/03/18 1221 100 %     Weight 03/03/18 1221 182 lb (82.6 kg)     Height 03/03/18 1221 6' (1.829 m)     Pain Score 03/03/18 1219 0    Constitutional: Alert and oriented. Well appearing and in no acute distress. Eyes: Conjunctivae are normal.  Head: Atraumatic. Nose: No congestion/rhinnorhea. Mouth/Throat: Mucous membranes are moist.  Neck: No stridor.    Respiratory: Normal respiratory effort.  Gastrointestinal: No distention.  Musculoskeletal: No lower extremity tenderness nor edema. No gross deformities of extremities. Neurologic:  Normal speech and language. No gross focal neurologic deficits are appreciated.  Skin: Right forearm with erythematous base 10 x 10 cm with a honey-crusted drainage noted. Mild swelling noted. At the boarder of this rash I do appreciate some circular rash with white, dry ring and central clearing consistent with tinea.  ____________________________________________   PROCEDURES  Procedure(s) performed:   Procedures  None ____________________________________________   INITIAL IMPRESSION / ASSESSMENT AND PLAN / ED COURSE  Pertinent labs & imaging results that were available during my care of the patient were reviewed by me and considered in my medical decision making (  see chart for details).  Patient with worsening right forearm rash. Initially diagnosed with tinea which seems appropriate based on some of the lesions at the boarder of the rash. The rash, however, appears to have become secondarily infected with strep consistent with impetigo. Will cover with Keflex and transition to PO therapy for tinea corporis. No additional topical medication advised. No concern for developing systemic infection. Discussed ED return precautions in detail with the patient.     ____________________________________________  FINAL CLINICAL IMPRESSION(S) / ED DIAGNOSES  Final diagnoses:  Impetigo  Tinea corporis    NEW OUTPATIENT MEDICATIONS STARTED DURING THIS VISIT:  Discharge Medication List as of 03/03/2018 12:37 PM    START taking these medications   Details  cephALEXin (KEFLEX) 500 MG capsule Take 1 capsule (500 mg total) by mouth 3 (three) times daily for 7 days., Starting Sun 03/03/2018, Until Sun 03/10/2018, Print    terbinafine (LAMISIL) 250 MG tablet Take 1 tablet (250 mg total) by mouth daily for 10 days., Starting Sun 03/03/2018, Until Wed 03/13/2018, Print        Note:  This document was prepared using Dragon voice recognition software and may include unintentional dictation errors.  Alona Bene, MD Emergency Medicine    Long, Arlyss Repress, MD 03/04/18 [phone]

## 2018-03-03 NOTE — ED Triage Notes (Addendum)
Pt seen here a week ago and dx with ringworm to R forearm.  He states it has now increased in size and has blisters around the area.

## 2018-03-05 ENCOUNTER — Emergency Department (HOSPITAL_BASED_OUTPATIENT_CLINIC_OR_DEPARTMENT_OTHER)
Admission: EM | Admit: 2018-03-05 | Discharge: 2018-03-05 | Disposition: A | Discharge status: Home / Self Care | Payer: 59 | Attending: Emergency Medicine | Admitting: Emergency Medicine

## 2018-03-05 ENCOUNTER — Encounter (HOSPITAL_BASED_OUTPATIENT_CLINIC_OR_DEPARTMENT_OTHER): Payer: Self-pay | Admitting: Emergency Medicine

## 2018-03-05 ENCOUNTER — Other Ambulatory Visit: Payer: Self-pay

## 2018-03-05 DIAGNOSIS — F17210 Nicotine dependence, cigarettes, uncomplicated: Secondary | ICD-10-CM | POA: Diagnosis not present

## 2018-03-05 DIAGNOSIS — R21 Rash and other nonspecific skin eruption: Secondary | ICD-10-CM | POA: Insufficient documentation

## 2018-03-05 DIAGNOSIS — Z79899 Other long term (current) drug therapy: Secondary | ICD-10-CM | POA: Insufficient documentation

## 2018-03-05 MED ORDER — MUPIROCIN 2 % EX OINT: 1.0000 "application " | TOPICAL_OINTMENT | Freq: Two times a day (BID) | CUTANEOUS | 0 refills | Status: DC

## 2018-03-05 MED ORDER — HYDROXYZINE HCL 25 MG PO TABS: 25.0000 mg | ORAL_TABLET | Freq: Four times a day (QID) | ORAL | 0 refills | Status: DC

## 2018-03-05 NOTE — ED Notes (Signed)
Pt/family verbalized understanding of discharge instructions.   

## 2018-03-05 NOTE — ED Triage Notes (Signed)
Pt states he continues to have rash and now it has spread to lower back.  Pt states it itches.  No numbness, burning or pain.

## 2018-03-05 NOTE — Discharge Instructions (Signed)
Please make an appointment at any of the above dermatology clinics or at the clinic of your choice for further management and evaluation of your rash. Return to the ER if you develop fever , chills, heat, or red streaking.

## 2018-03-05 NOTE — ED Provider Notes (Signed)
[facility] EMERGENCY DEPARTMENT Provider Note   CSN: [id number] Arrival date & time: 03/05/18  1006     History   Chief Complaint Chief Complaint  Patient presents with  . Rash    HPI Aydeen Blume is a 42 y.o. male [place] emergency department chief complaint of rash.  Patient was on 02/24/2018 and diagnosed with rash and nonspecific skin eruption.  He was started on ketoconazole 2% cream.  He returned on the 27th was diagnosed with secondary impetigo eruption and switched to oral terbinafine and Keflex.  The patient has noticed improvement in the eruption on his arm but yesterday developed an itchy rash on his back.  He describes both of the rash eruptions as itchy.  They are nonpainful.  He denies heat redness swelling or fevers.  He denies wheezing, tongue swelling, hives or signs of anaphylactoid reactions.  HPI  Past Medical History:  Diagnosis Date  . Chest pain 01/28/2018  . Palpitation   . Right knee pain     Patient Active Problem List   Diagnosis Date Noted  . Chest pain 01/28/2018  . Right knee pain 08/21/2017    Past Surgical History:  Procedure Laterality Date  . PATELLA FRACTURE SURGERY          Home Medications    Prior to Admission medications   Medication Sig Start Date End Date Taking? Authorizing Provider  cephALEXin (KEFLEX) 500 MG capsule Take 1 capsule (500 mg total) by mouth 3 (three) times daily for 7 days. 03/03/18 03/10/18  Long, Arlyss Repress, MD  ketoconazole (NIZORAL) 2 % cream Apply 1 application topically 2 (two) times daily. 02/24/18   Bethel Born, PA-C  metoprolol tartrate (LOPRESSOR) 50 MG tablet Take 1 tablet (50 mg total) by mouth 2 (two) times daily. 02/03/18   Azalia Bilis, MD  nitroGLYCERIN (NITRODUR - DOSED IN MG/24 HR) 0.2 mg/hr patch APPLY 1/4TH PATCH TO AFFECTED KNEE, CHANGE DAILY 11/23/17   Hudnall, Azucena Fallen, MD  terbinafine (LAMISIL) 250 MG tablet Take 1 tablet (250 mg total) by mouth daily for 10 days. 03/03/18  03/13/18  Long, Arlyss Repress, MD    Family History Family History  Problem Relation Age of Onset  . Cancer Mother        ESOPHAGEAL  . Diabetes Father   . Heart Problems Father        CARDIOMEGALY  . Healthy Brother     Social History Social History   Tobacco Use  . Smoking status: Current Every Day Smoker    Types: Cigarettes  . Smokeless tobacco: Never Used  Substance Use Topics  . Alcohol use: Yes    Comment: Rare  . Drug use: Yes    Types: Marijuana     Allergies   Patient has no known allergies.   Review of Systems Review of Systems  Ten systems reviewed and are negative for acute change, except as noted in the HPI.   Physical Exam Updated Vital Signs BP 116/84   Pulse (!) 56   Temp 97.9 F (36.6 C)   Resp 16   Ht 6' (1.829 m)   Wt 81.2 kg   SpO2 99%   BMI 24.28 kg/m   Physical Exam Physical Exam  Nursing note and vitals reviewed. Constitutional: He appears well-developed and well-nourished. No distress.  HENT:  Head: Normocephalic and atraumatic.  Eyes: Conjunctivae normal are normal. No scleral icterus.  Neck: Normal range of motion. Neck supple.  Cardiovascular: Normal rate, regular rhythm and  normal heart sounds.   Pulmonary/Chest: Effort normal and breath sounds normal. No respiratory distress.  Abdominal: Soft. There is no tenderness.  Musculoskeletal: He exhibits no edema.  Neurological: He is alert.  Skin: Skin is warm and dry. He is not diaphoretic.  Right forearm has an erythematous base central clearing about 10 cm x 10 cm.  There are multiple singular and confluent tight vesicles around the border with noted honey colored crusts.  On the patient's back there is a maculopapular erythematous rash without excoriation in a similar pattern. Psychiatric: His behavior is normal.     ED Treatments / Results  Labs (all labs ordered are listed, but only abnormal results are displayed) Labs Reviewed - No data to  display  EKG None  Radiology No results found.  Procedures Procedures (including critical care time)  Medications Ordered in ED Medications - No data to display   Initial Impression / Assessment and Plan / ED Course  I have reviewed the triage vital signs and the nursing notes.  Pertinent labs & imaging results that were available during my care of the patient were reviewed by me and considered in my medical decision making (see chart for details).     Patient continues to have abruption.  I believe the area on the patient's back although not vesicular may be in the early stages.  I am unsure of the patient's irruption although I do agree with the concern for impetigo given the patient's honey colored crust.  Patient will be given Atarax and Bactroban ointment.  I have given him the name of multiple dermatologists.  He is advised to call today to schedule appointment for follow-up for definitive diagnosis.  The patient does not appear to have any emergent rashes such as TENS, SJS.  He appears appropriate for discharge at this time Final Clinical Impressions(s) / ED Diagnoses   Final diagnoses:  None    ED Discharge Orders    None       Arthor Captain, PA-C 03/05/18 1122    Melene Plan, DO 03/05/18 1350

## 2018-03-06 ENCOUNTER — Ambulatory Visit: Payer: 59 | Admitting: Internal Medicine

## 2018-03-06 ENCOUNTER — Encounter (INDEPENDENT_AMBULATORY_CARE_PROVIDER_SITE_OTHER): Payer: Self-pay

## 2018-03-06 ENCOUNTER — Encounter: Payer: Self-pay | Admitting: Internal Medicine

## 2018-03-06 VITALS — BP 110/70 | HR 65 | Ht 72.0 in | Wt 183.6 lb

## 2018-03-06 DIAGNOSIS — R002 Palpitations: Secondary | ICD-10-CM

## 2018-03-06 DIAGNOSIS — Z23 Encounter for immunization: Secondary | ICD-10-CM

## 2018-03-06 DIAGNOSIS — I484 Atypical atrial flutter: Secondary | ICD-10-CM

## 2018-03-06 NOTE — Progress Notes (Signed)
HPI Brandon [name] is referred today by Dr. Marsa Aris for evaluation of atrial flutter vs atrial tachycardia. He is a pleasant 42 yo mna with the above history including associated palpitations. He notes that the episodes began back in September when he was under some increased stress. The patient developed palpitations prompting him to seek medical attention where he was noted to have an ECG which showed what I think is supraventricular tachycardia at 160/min. It looks like a short RP tachy. The patient has been taking beta blockers and his symptoms have improved. No chest pain or sob.  No Known Allergies   Current Outpatient Medications  Medication Sig Dispense Refill  . cephALEXin (KEFLEX) 500 MG capsule Take 1 capsule (500 mg total) by mouth 3 (three) times daily for 7 days. 21 capsule 0  . hydrOXYzine (ATARAX/VISTARIL) 25 MG tablet Take 1 tablet (25 mg total) by mouth every 6 (six) hours. 12 tablet 0  . metoprolol tartrate (LOPRESSOR) 50 MG tablet Take 1 tablet (50 mg total) by mouth 2 (two) times daily. 60 tablet 0  . mupirocin ointment (BACTROBAN) 2 % Place 1 application into the nose 2 (two) times daily. To the affected are and inside the nose 30 g 0  . nitroGLYCERIN (NITRODUR - DOSED IN MG/24 HR) 0.2 mg/hr patch APPLY 1/4TH PATCH TO AFFECTED KNEE, CHANGE DAILY 30 patch 1  . terbinafine (LAMISIL) 250 MG tablet Take 1 tablet (250 mg total) by mouth daily for 10 days. 10 tablet 0   No current facility-administered medications for this visit.      Past Medical History:  Diagnosis Date  . Chest pain 01/28/2018  . Palpitation   . Right knee pain     ROS:   All systems reviewed and negative except as noted in the HPI.   Past Surgical History:  Procedure Laterality Date  . PATELLA FRACTURE SURGERY       Family History  Problem Relation Age of Onset  . Cancer Mother        ESOPHAGEAL  . Diabetes Father   . Heart Problems Father        CARDIOMEGALY  . Healthy Brother       Social History   Socioeconomic History  . Marital status: Married    Spouse name: Not on file  . Number of children: 2  . Years of education: Not on file  . Highest education level: Not on file  Occupational History  . Not on file  Social Needs  . Financial resource strain: Not on file  . Food insecurity:    Worry: Not on file    Inability: Not on file  . Transportation needs:    Medical: Not on file    Non-medical: Not on file  Tobacco Use  . Smoking status: Current Every Day Smoker    Types: Cigarettes  . Smokeless tobacco: Never Used  Substance and Sexual Activity  . Alcohol use: Yes    Comment: Rare  . Drug use: Yes    Types: Marijuana  . Sexual activity: Not on file  Lifestyle  . Physical activity:    Days per week: Not on file    Minutes per session: Not on file  . Stress: Not on file  Relationships  . Social connections:    Talks on phone: Not on file    Gets together: Not on file    Attends religious service: Not on file    Active member of club or  organization: Not on file    Attends meetings of clubs or organizations: Not on file    Relationship status: Not on file  . Intimate partner violence:    Fear of current or ex partner: Not on file    Emotionally abused: Not on file    Physically abused: Not on file    Forced sexual activity: Not on file  Other Topics Concern  . Not on file  Social History Narrative  . Not on file     BP 110/70   Pulse 65   Ht 6' (1.829 m)   Wt 183 lb 9.6 oz (83.3 kg)   SpO2 97%   BMI 24.90 kg/m   Physical Exam:  Well appearing 42 yo man, NAD HEENT: Unremarkable Neck:  6 cm JVD, no thyromegally Lymphatics:  No adenopathy Back:  No CVA tenderness Lungs:  Clear with no wheezes HEART:  Regular rate rhythm, no murmurs, no rubs, no clicks Abd:  soft, positive bowel sounds, no organomegally, no rebound, no guarding Ext:  2 plus pulses, no edema, no cyanosis, no clubbing Skin:  No rashes no nodules Neuro:  CN  II through XII intact, motor grossly intact  EKG - nsr with no pre-excitation   Assess/Plan: 1. Palpitations - these are much improved. He will continue his beta blocker for now. I will see him back in 6 months. 2. SVT - I do not think that this is atrial flutter but could be atrial tachy or AVRT. He will undergo watchful waiting.  Leonia Reeves.D.

## 2018-03-06 NOTE — Patient Instructions (Signed)
Medication Instructions:  Your physician recommends that you continue on your current medications as directed. Please refer to the Current Medication list given to you today.  If you need a refill on your cardiac medications before your next appointment, please call your pharmacy.   Lab work: None ordered.  If you have labs (blood work) drawn today and your tests are completely normal, you will receive your results only by: Marland Kitchen MyChart Message (if you have MyChart) OR . A paper copy in the mail If you have any lab test that is abnormal or we need to change your treatment, we will call you to review the results.  Testing/Procedures: None ordered.  Follow-Up:  Your physician wants you to follow-up in: 6 months with Dr. Ladona Ridgel.    You will receive a reminder letter in the mail two months in advance. If you don't receive a letter, please call our office to schedule the follow-up appointment.  At [facility], you and your health needs are our priority.  As part of our continuing mission to provide you with exceptional heart care, we have created designated Provider Care Teams.  These Care Teams include your primary Cardiologist (physician) and Advanced Practice Providers (APPs -  Physician Assistants and Nurse Practitioners) who all work together to provide you with the care you need, when you need it.  Any Other Special Instructions Will Be Listed Below (If Applicable).

## 2018-05-31 NOTE — Progress Notes (Deleted)
HPI: FU SVT.  Echocardiogram October 2019 showed normal LV function, trace aortic insufficiency and mitral regurgitation.  Seen by Dr. Ladona Ridgelaylor October 2019 and previous rhythm felt to be either atrial tachycardia or AVRT.  Medical therapy recommended with close follow-up.  Since last seen  Current Outpatient Medications  Medication Sig Dispense Refill  . hydrOXYzine (ATARAX/VISTARIL) 25 MG tablet Take 1 tablet (25 mg total) by mouth every 6 (six) hours. 12 tablet 0  . metoprolol tartrate (LOPRESSOR) 50 MG tablet Take 1 tablet (50 mg total) by mouth 2 (two) times daily. 60 tablet 0  . mupirocin ointment (BACTROBAN) 2 % Place 1 application into the nose 2 (two) times daily. To the affected are and inside the nose 30 g 0  . nitroGLYCERIN (NITRODUR - DOSED IN MG/24 HR) 0.2 mg/hr patch APPLY 1/4TH PATCH TO AFFECTED KNEE, CHANGE DAILY 30 patch 1   No current facility-administered medications for this visit.      Past Medical History:  Diagnosis Date  . Chest pain 01/28/2018  . Palpitation   . Right knee pain     Past Surgical History:  Procedure Laterality Date  . PATELLA FRACTURE SURGERY      Social History   Socioeconomic History  . Marital status: Married    Spouse name: Not on file  . Number of children: 2  . Years of education: Not on file  . Highest education level: Not on file  Occupational History  . Not on file  Social Needs  . Financial resource strain: Not on file  . Food insecurity:    Worry: Not on file    Inability: Not on file  . Transportation needs:    Medical: Not on file    Non-medical: Not on file  Tobacco Use  . Smoking status: Current Every Day Smoker    Types: Cigarettes  . Smokeless tobacco: Never Used  Substance and Sexual Activity  . Alcohol use: Yes    Comment: Rare  . Drug use: Yes    Types: Marijuana  . Sexual activity: Not on file  Lifestyle  . Physical activity:    Days per week: Not on file    Minutes per session: Not on file    . Stress: Not on file  Relationships  . Social connections:    Talks on phone: Not on file    Gets together: Not on file    Attends religious service: Not on file    Active member of club or organization: Not on file    Attends meetings of clubs or organizations: Not on file    Relationship status: Not on file  . Intimate partner violence:    Fear of current or ex partner: Not on file    Emotionally abused: Not on file    Physically abused: Not on file    Forced sexual activity: Not on file  Other Topics Concern  . Not on file  Social History Narrative  . Not on file    Family History  Problem Relation Age of Onset  . Cancer Mother        ESOPHAGEAL  . Diabetes Father   . Heart Problems Father        CARDIOMEGALY  . Healthy Brother     ROS: no fevers or chills, productive cough, hemoptysis, dysphasia, odynophagia, melena, hematochezia, dysuria, hematuria, rash, seizure activity, orthopnea, PND, pedal edema, claudication. Remaining systems are negative.  Physical Exam: Well-developed well-nourished in no acute distress.  Skin is warm and dry.  HEENT is normal.  Neck is supple.  Chest is clear to auscultation with normal expansion.  Cardiovascular exam is regular rate and rhythm.  Abdominal exam nontender or distended. No masses palpated. Extremities show no edema. neuro grossly intact  ECG- personally reviewed  A/P  1 supraventricular tachycardia-symptoms are reasonably well controlled.  Continue metoprolol at present dose.  Can consider ablation if symptoms are not controlled with medications.  2 tobacco abuse-patient counseled on discontinuing.  Olga Millers, MD

## 2018-06-11 ENCOUNTER — Ambulatory Visit: Payer: 59 | Admitting: Cardiology

## 2018-06-19 ENCOUNTER — Encounter: Payer: Self-pay | Admitting: Cardiology

## 2018-06-19 ENCOUNTER — Ambulatory Visit: Payer: 59 | Admitting: Cardiology

## 2018-06-19 DIAGNOSIS — I471 Supraventricular tachycardia: Secondary | ICD-10-CM | POA: Insufficient documentation

## 2018-06-19 MED ORDER — METOPROLOL TARTRATE 25 MG PO TABS: 25.0000 mg | ORAL_TABLET | Freq: Two times a day (BID) | ORAL | 1 refills | Status: DC | PRN

## 2018-06-19 NOTE — Assessment & Plan Note (Signed)
Evaluated by Dr Ladona Ridgel Oct 2019- plan is for beta blocker Rx Echo- normal LVF

## 2018-06-19 NOTE — Progress Notes (Signed)
06/19/2018 Brandon [name]   [birth year]  [id number]  Primary Physician Patient, No Pcp Per Primary Cardiologist: Dr Jens Som- Dr Ladona Ridgel EP  HPI: The patient is a 43 year old male who was seen in October 2019 for tachycardia.  Initially this was thought to be atrial flutter, but after review by Dr. Ladona Ridgel it was felt this was PSVT.  Echocardiogram showed normal LV function.  Patient was treated with beta blocker.  He is in the office today for follow-up.  He admits that he is not taking the beta-blocker anymore.  He attributes the episode of tachycardia to stress related to work.  He says the situation is now resolved.  He is not had any recurrent tachycardia.  He drinks 1 cup of coffee a day.  He exercises daily without issues.  He knows to avoid over-the-counter decongestants.   Current Outpatient Medications  Medication Sig Dispense Refill  . hydrOXYzine (ATARAX/VISTARIL) 25 MG tablet Take 1 tablet (25 mg total) by mouth every 6 (six) hours. 12 tablet 0  . mupirocin ointment (BACTROBAN) 2 % Place 1 application into the nose 2 (two) times daily. To the affected are and inside the nose 30 g 0  . nitroGLYCERIN (NITRODUR - DOSED IN MG/24 HR) 0.2 mg/hr patch APPLY 1/4TH PATCH TO AFFECTED KNEE, CHANGE DAILY 30 patch 1  . metoprolol tartrate (LOPRESSOR) 50 MG tablet Take 1 tablet (50 mg total) by mouth 2 (two) times daily. (Patient not taking: Reported on 06/19/2018) 60 tablet 0   No current facility-administered medications for this visit.     No Known Allergies  Past Medical History:  Diagnosis Date  . Chest pain 01/28/2018  . Palpitation   . Right knee pain     Social History   Socioeconomic History  . Marital status: Married    Spouse name: Not on file  . Number of children: 2  . Years of education: Not on file  . Highest education level: Not on file  Occupational History  . Not on file  Social Needs  . Financial resource strain: Not on file  . Food insecurity:    Worry: Not  on file    Inability: Not on file  . Transportation needs:    Medical: Not on file    Non-medical: Not on file  Tobacco Use  . Smoking status: Current Every Day Smoker    Types: Cigarettes  . Smokeless tobacco: Never Used  Substance and Sexual Activity  . Alcohol use: Yes    Comment: Rare  . Drug use: Yes    Types: Marijuana  . Sexual activity: Not on file  Lifestyle  . Physical activity:    Days per week: Not on file    Minutes per session: Not on file  . Stress: Not on file  Relationships  . Social connections:    Talks on phone: Not on file    Gets together: Not on file    Attends religious service: Not on file    Active member of club or organization: Not on file    Attends meetings of clubs or organizations: Not on file    Relationship status: Not on file  . Intimate partner violence:    Fear of current or ex partner: Not on file    Emotionally abused: Not on file    Physically abused: Not on file    Forced sexual activity: Not on file  Other Topics Concern  . Not on file  Social History Narrative  . Not  on file     Family History  Problem Relation Age of Onset  . Cancer Mother        ESOPHAGEAL  . Diabetes Father   . Heart Problems Father        CARDIOMEGALY  . Healthy Brother      Review of Systems: General: negative for chills, fever, night sweats or weight changes.  Cardiovascular: negative for chest pain, dyspnea on exertion, edema, orthopnea, palpitations, paroxysmal nocturnal dyspnea or shortness of breath Dermatological: negative for rash Respiratory: negative for cough or wheezing Urologic: negative for hematuria Abdominal: negative for nausea, vomiting, diarrhea, bright red blood per rectum, melena, or hematemesis Neurologic: negative for visual changes, syncope, or dizziness All other systems reviewed and are otherwise negative except as noted above.    Blood pressure 116/72, pulse 81, height 6' (1.829 m), weight 183 lb (83 kg), SpO2 99 %.   General appearance: alert, cooperative and no distress Neck: no carotid bruit, no JVD and thyroid not enlarged, symmetric, no tenderness/mass/nodules Lungs: clear to auscultation bilaterally Heart: regular rate and rhythm Extremities: no edema Skin: Skin color, texture, turgor normal. No rashes or lesions Neurologic: Grossly normal  EKG [id number]  ASSESSMENT AND PLAN:   PSVT (paroxysmal supraventricular tachycardia) (HCC) Evaluated by Dr Ladona Ridgel Oct 2019- plan is for beta blocker Rx Echo- normal LVF   PLAN I provided the patient with a prescription for as needed metoprolol 25 mg twice daily.  We will see him back in a year or sooner if he has any recurrent issues.  Corine Shelter PA-C 06/19/2018 10:15 AM

## 2018-06-19 NOTE — Patient Instructions (Addendum)
Medication Instructions:  START Lopressor 25mg  Take 1 tablet by mouth twice a day as needed for palpitations If you need a refill on your cardiac medications before your next appointment, please call your pharmacy.   Lab work: None  If you have labs (blood work) drawn today and your tests are completely normal, you will receive your results only by: Marland Kitchen MyChart Message (if you have MyChart) OR . A paper copy in the mail If you have any lab test that is abnormal or we need to change your treatment, we will call you to review the results.  Testing/Procedures: None   Follow-Up: At [facility], you and your health needs are our priority.  As part of our continuing mission to provide you with exceptional heart care, we have created designated Provider Care Teams.  These Care Teams include your primary Cardiologist (physician) and Advanced Practice Providers (APPs -  Physician Assistants and Nurse Practitioners) who all work together to provide you with the care you need, when you need it. You will need a follow up appointment in 12 months.  Please call our office 2 months in advance to schedule this appointment.  You may see Dr Olga Millers or one of the following Advanced Practice Providers on your designated Care Team:   Corine Shelter, PA-C Judy Pimple, [place] . Marjie Skiff, PA-C  Any Other Special Instructions Will Be Listed Below (If Applicable).

## 2018-07-10 ENCOUNTER — Ambulatory Visit (INDEPENDENT_AMBULATORY_CARE_PROVIDER_SITE_OTHER): Payer: 59 | Admitting: Family Medicine

## 2018-07-10 ENCOUNTER — Encounter: Payer: Self-pay | Admitting: Family Medicine

## 2018-07-10 VITALS — BP 122/70 | HR 71 | Ht 72.0 in | Wt 179.4 lb

## 2018-07-10 DIAGNOSIS — Z0000 Encounter for general adult medical examination without abnormal findings: Secondary | ICD-10-CM | POA: Diagnosis not present

## 2018-07-10 DIAGNOSIS — Z125 Encounter for screening for malignant neoplasm of prostate: Secondary | ICD-10-CM | POA: Diagnosis not present

## 2018-07-10 LAB — CBC
HEMATOCRIT: 45.3 % (ref 39.0–52.0)
HEMOGLOBIN: 14.9 g/dL (ref 13.0–17.0)
MCHC: 33 g/dL (ref 30.0–36.0)
MCV: 86.2 fl (ref 78.0–100.0)
PLATELETS: 248 10*3/uL (ref 150.0–400.0)
RBC: 5.25 Mil/uL (ref 4.22–5.81)
RDW: 13.2 % (ref 11.5–15.5)
WBC: 6.2 10*3/uL (ref 4.0–10.5)

## 2018-07-10 LAB — COMPREHENSIVE METABOLIC PANEL
ALK PHOS: 64 U/L (ref 39–117)
ALT: 25 U/L (ref 0–53)
AST: 34 U/L (ref 0–37)
Albumin: 4.6 g/dL (ref 3.5–5.2)
BUN: 15 mg/dL (ref 6–23)
CHLORIDE: 103 meq/L (ref 96–112)
CO2: 27 meq/L (ref 19–32)
Calcium: 9.6 mg/dL (ref 8.4–10.5)
Creatinine, Ser: 1.23 mg/dL (ref 0.40–1.50)
GFR: 77.18 mL/min (ref >60.00)
GLUCOSE: 74 mg/dL (ref 70–99)
POTASSIUM: 4 meq/L (ref 3.5–5.1)
SODIUM: 139 meq/L (ref 135–145)
TOTAL PROTEIN: 7.1 g/dL (ref 6.0–8.3)
Total Bilirubin: 1.4 mg/dL — ABNORMAL HIGH (ref 0.2–1.2)

## 2018-07-10 LAB — LIPID PANEL
CHOL/HDL RATIO: 4
Cholesterol: 213 mg/dL — ABNORMAL HIGH (ref 0–200)
HDL: 47.7 mg/dL (ref >39.00)
LDL Cholesterol: 141 mg/dL — ABNORMAL HIGH (ref 0–99)
NonHDL: 165.78
Triglycerides: 126 mg/dL (ref 0.0–149.0)
VLDL: 25.2 mg/dL (ref 0.0–40.0)

## 2018-07-10 LAB — PSA: PSA: 0.42 ng/mL (ref 0.10–4.00)

## 2018-07-10 NOTE — Patient Instructions (Signed)
Preventive Care 40-64 Years, Male Preventive care refers to lifestyle choices and visits with your health care provider that can promote health and wellness. What does preventive care include?   A yearly physical exam. This is also called an annual well check.  Dental exams once or twice a year.  Routine eye exams. Ask your health care provider how often you should have your eyes checked.  Personal lifestyle choices, including: ? Daily care of your teeth and gums. ? Regular physical activity. ? Eating a healthy diet. ? Avoiding tobacco and drug use. ? Limiting alcohol use. ? Practicing safe sex. ? Taking low-dose aspirin every day starting at age 50. What happens during an annual well check? The services and screenings done by your health care provider during your annual well check will depend on your age, overall health, lifestyle risk factors, and family history of disease. Counseling Your health care provider may ask you questions about your:  Alcohol use.  Tobacco use.  Drug use.  Emotional well-being.  Home and relationship well-being.  Sexual activity.  Eating habits.  Work and work environment. Screening You may have the following tests or measurements:  Height, weight, and BMI.  Blood pressure.  Lipid and cholesterol levels. These may be checked every 5 years, or more frequently if you are over 50 years old.  Skin check.  Lung cancer screening. You may have this screening every year starting at age 55 if you have a 30-pack-year history of smoking and currently smoke or have quit within the past 15 years.  Colorectal cancer screening. All adults should have this screening starting at age 50 and continuing until age 75. Your health care provider may recommend screening at age 45. You will have tests every 1-10 years, depending on your results and the type of screening test. People at increased risk should start screening at an earlier age. Screening tests may  include: ? Guaiac-based fecal occult blood testing. ? Fecal immunochemical test (FIT). ? Stool DNA test. ? Virtual colonoscopy. ? Sigmoidoscopy. During this test, a flexible tube with a tiny camera (sigmoidoscope) is used to examine your rectum and lower colon. The sigmoidoscope is inserted through your anus into your rectum and lower colon. ? Colonoscopy. During this test, a long, thin, flexible tube with a tiny camera (colonoscope) is used to examine your entire colon and rectum.  Prostate cancer screening. Recommendations will vary depending on your family history and other risks.  Hepatitis C blood test.  Hepatitis B blood test.  Sexually transmitted disease (STD) testing.  Diabetes screening. This is done by checking your blood sugar (glucose) after you have not eaten for a while (fasting). You may have this done every 1-3 years. Discuss your test results, treatment options, and if necessary, the need for more tests with your health care provider. Vaccines Your health care provider may recommend certain vaccines, such as:  Influenza vaccine. This is recommended every year.  Tetanus, diphtheria, and acellular pertussis (Tdap, Td) vaccine. You may need a Td booster every 10 years.  Varicella vaccine. You may need this if you have not been vaccinated.  Zoster vaccine. You may need this after age 60.  Measles, mumps, and rubella (MMR) vaccine. You may need at least one dose of MMR if you were born in 1957 or later. You may also need a second dose.  Pneumococcal 13-valent conjugate (PCV13) vaccine. You may need this if you have certain conditions and have not been vaccinated.  Pneumococcal polysaccharide (PPSV23) vaccine.   You may need one or two doses if you smoke cigarettes or if you have certain conditions.  Meningococcal vaccine. You may need this if you have certain conditions.  Hepatitis A vaccine. You may need this if you have certain conditions or if you travel or work in  places where you may be exposed to hepatitis A.  Hepatitis B vaccine. You may need this if you have certain conditions or if you travel or work in places where you may be exposed to hepatitis B.  Haemophilus influenzae type b (Hib) vaccine. You may need this if you have certain risk factors. Talk to your health care provider about which screenings and vaccines you need and how often you need them. This information is not intended to replace advice given to you by your health care provider. Make sure you discuss any questions you have with your health care provider. Document Released: 05/21/2015 Document Revised: 06/14/2017 Document Reviewed: 02/23/2015 Elsevier Interactive Patient Education  2019 [place] Risks of Smoking Smoking cigarettes is very bad for your health. Tobacco smoke has over 200 known poisons in it. It contains the poisonous gases nitrogen oxide and carbon monoxide. There are over 60 chemicals in tobacco smoke that cause cancer. Smoking is difficult to quit because a chemical in tobacco, called nicotine, causes addiction or dependence. When you smoke and inhale, nicotine is absorbed rapidly into the bloodstream through your lungs. Both inhaled and non-inhaled nicotine may be addictive. What are the risks of cigarette smoke? Cigarette smokers have an increased risk of many serious medical problems, including:  Lung cancer.  Lung disease, such as pneumonia, bronchitis, and emphysema.  Chest pain (angina) and heart attack because the heart is not getting enough oxygen.  Heart disease and peripheral blood vessel disease.  High blood pressure (hypertension).  Stroke.  Oral cancer, including cancer of the lip, mouth, or voice box.  Bladder cancer.  Pancreatic cancer.  Cervical cancer.  Pregnancy complications, including premature birth.  Stillbirths and smaller newborn babies, birth defects, and genetic damage to sperm.  Early menopause.  Lower  estrogen level for women.  Infertility.  Facial wrinkles.  Blindness.  Increased risk of broken bones (fractures).  Senile dementia.  Stomach ulcers and internal bleeding.  Delayed wound healing and increased risk of complications during surgery.  Even smoking lightly shortens your life expectancy by several years. Because of secondhand smoke exposure, children of smokers have an increased risk of the following:  Sudden infant death syndrome (SIDS).  Respiratory infections.  Lung cancer.  Heart disease.  Ear infections. What are the benefits of quitting? There are many health benefits of quitting smoking. Here are some of them:  Within days of quitting smoking, your risk of having a heart attack decreases, your blood flow improves, and your lung capacity improves. Blood pressure, pulse rate, and breathing patterns start returning to normal soon after quitting.  Within months, your lungs may clear up completely.  Quitting for 10 years reduces your risk of developing lung cancer and heart disease to almost that of a nonsmoker.  People who quit may see an improvement in their overall quality of life. How do I quit smoking?     Smoking is an addiction with both physical and psychological effects, and longtime habits can be hard to change. Your health care provider can recommend:  Programs and community resources, which may include group support, education, or talk therapy.  Prescription medicines to help reduce cravings.  Nicotine replacement products, such  as patches, gum, and nasal sprays. Use these products only as directed. Do not replace cigarette smoking with electronic cigarettes, which are commonly called e-cigarettes. The safety of e-cigarettes is not known, and some may contain harmful chemicals.  A combination of two or more of these methods. Where to find more information  American Lung Association: www.lung.org  American Cancer Society:  www.cancer.org Summary  Smoking cigarettes is very bad for your health. Cigarette smokers have an increased risk of many serious medical problems, including several cancers, heart disease, and stroke.  Smoking is an addiction with both physical and psychological effects, and longtime habits can be hard to change.  By stopping right away, you can greatly reduce the risk of medical problems for you and your family.  To help you quit smoking, your health care provider can recommend programs, community resources, prescription medicines, and nicotine replacement products such as patches, gum, and nasal sprays. This information is not intended to replace advice given to you by your health care provider. Make sure you discuss any questions you have with your health care provider. Document Released: 06/01/2004 Document Revised: 07/26/2017 Document Reviewed: 04/28/2016 Elsevier Interactive Patient Education  2019 [employer].

## 2018-07-10 NOTE — Progress Notes (Signed)
Established Patient Office Visit  Subjective:  Patient ID: Brandon [name], male    DOB: 04-17-1976  Age: 43 y.o. MRN: [id number]  CC:  Chief Complaint  Patient presents with  . Establish Care  . Annual Exam    HPI Brandon [name] presents for establishment of care and a complete physical exam.  Patient enjoys good health as far as he knows.  He is an avid cyclist and works out on most days of the week.  He is currently working in a factory job however this does not characterize his work history.  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  He is work for [employer].  He has been an Event organiser.  This type of work came to an end for him because of home responsibilities.  He is married and has a 42-year-old son.  His father is 79 and has diabetes myopathy.  His mom died at age 100 from esophageal cancer.  Is been a heavy smoker.  Patient rarely drinks alcohol no more than 2-3 drinks in a month.  He does have a history of marijuana use but not currently.  He is smoking a half a pack of Marlboro lights daily.  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  This happened when he was under more work stress.  He uses metoprolol as needed.  He had been using a nitroglycerin patch for knee pain as prescribed by sports medicine.  He is no longer using this. He and his wife continue to live in the same household but are somewhat estranged. Recently screened neg for Hiv. Non fasting.  Past Medical History:  Diagnosis Date  . Chest pain 01/28/2018  . Palpitation   . Right knee pain     Past Surgical History:  Procedure Laterality Date  . PATELLA FRACTURE SURGERY      Family History  Problem Relation Age of Onset  . Cancer Mother        ESOPHAGEAL  . Diabetes Father   . Heart Problems Father        CARDIOMEGALY  . Healthy Brother     Social History   Socioeconomic History  . Marital status: Married    Spouse name: Not on file  . Number of  children: 2  . Years of education: Not on file  . Highest education level: Not on file  Occupational History  . Not on file  Social Needs  . Financial resource strain: Not on file  . Food insecurity:    Worry: Not on file    Inability: Not on file  . Transportation needs:    Medical: Not on file    Non-medical: Not on file  Tobacco Use  . Smoking status: Current Every Day Smoker    Types: Cigarettes  . Smokeless tobacco: Never Used  Substance and Sexual Activity  . Alcohol use: Yes    Comment: Rare  . Drug use: Yes    Types: Marijuana  . Sexual activity: Not on file  Lifestyle  . Physical activity:    Days per week: Not on file    Minutes per session: Not on file  . Stress: Not on file  Relationships  . Social connections:    Talks on phone: Not on file    Gets together: Not on file    Attends religious service: Not on file    Active member of club or organization:  Not on file    Attends meetings of clubs or organizations: Not on file    Relationship status: Not on file  . Intimate partner violence:    Fear of current or ex partner: Not on file    Emotionally abused: Not on file    Physically abused: Not on file    Forced sexual activity: Not on file  Other Topics Concern  . Not on file  Social History Narrative  . Not on file    Outpatient Medications Prior to Visit  Medication Sig Dispense Refill  . hydrOXYzine (ATARAX/VISTARIL) 25 MG tablet Take 1 tablet (25 mg total) by mouth every 6 (six) hours. 12 tablet 0  . mupirocin ointment (BACTROBAN) 2 % Place 1 application into the nose 2 (two) times daily. To the affected are and inside the nose 30 g 0  . nitroGLYCERIN (NITRODUR - DOSED IN MG/24 HR) 0.2 mg/hr patch APPLY 1/4TH PATCH TO AFFECTED KNEE, CHANGE DAILY 30 patch 1  . metoprolol tartrate (LOPRESSOR) 25 MG tablet Take 1 tablet (25 mg total) by mouth 2 (two) times daily as needed. for palpitations (Patient not taking: Reported on 07/10/2018) 15 tablet 1   No  facility-administered medications prior to visit.     No Known Allergies  ROS Review of Systems  Constitutional: Negative.   HENT: Negative.   Eyes: Negative for photophobia and visual disturbance.  Respiratory: Negative.  Negative for shortness of breath.   Cardiovascular: Negative.  Negative for chest pain, palpitations and leg swelling.  Gastrointestinal: Negative.   Endocrine: Negative for polyphagia and polyuria.  Genitourinary: Negative.   Musculoskeletal: Negative.   Neurological: Negative.   Hematological: Negative.   Psychiatric/Behavioral: Negative.       Objective:    Physical Exam  Constitutional: He is oriented to person, place, and time. He appears well-developed and well-nourished. No distress.  HENT:  Head: Normocephalic and atraumatic.  Right Ear: External ear normal.  Left Ear: External ear normal.  Mouth/Throat: Oropharynx is clear and moist. No oropharyngeal exudate.  Eyes: Pupils are equal, round, and reactive to light. Conjunctivae are normal. Right eye exhibits no discharge. Left eye exhibits no discharge. No scleral icterus.  Neck: Neck supple. No JVD present. No tracheal deviation present. No thyromegaly present.  Cardiovascular: Normal rate, regular rhythm and normal heart sounds. Exam reveals no gallop and no friction rub.  No murmur heard. Pulmonary/Chest: Effort normal and breath sounds normal. No stridor.  Abdominal: Soft. Bowel sounds are normal. He exhibits no distension and no mass. There is no abdominal tenderness. There is no rebound and no guarding. Hernia confirmed negative in the right inguinal area and confirmed negative in the left inguinal area.  Genitourinary:    Penis normal.  Right testis shows no mass, no swelling and no tenderness. Right testis is descended. Left testis shows no mass, no swelling and no tenderness. Left testis is descended. Circumcised. No hypospadias, penile erythema or penile tenderness. No discharge found.    Lymphadenopathy:    He has no cervical adenopathy.       Right: No inguinal adenopathy present.       Left: No inguinal adenopathy present.  Neurological: He is alert and oriented to person, place, and time.  Skin: Skin is warm and dry. He is not diaphoretic.  Psychiatric: He has a normal mood and affect. His behavior is normal.    BP 122/70   Pulse 71   Ht 6' (1.829 m)   Wt 179 lb 6  oz (81.4 kg)   SpO2 99%   BMI 24.33 kg/m  Wt Readings from Last 3 Encounters:  07/10/18 179 lb 6 oz (81.4 kg)  06/19/18 183 lb (83 kg)  03/06/18 183 lb 9.6 oz (83.3 kg)   BP Readings from Last 3 Encounters:  07/10/18 122/70  06/19/18 116/72  03/06/18 110/70   Guideline developer:  UpToDate (see UpToDate for funding source) Date Released: June 2014  Health Maintenance Due  Topic Date Due  . HIV Screening  01/22/1989  . TETANUS/TDAP  01/22/1993    There are no preventive care reminders to display for this patient.  Lab Results  Component Value Date   TSH 0.745 02/03/2018   Lab Results  Component Value Date   WBC 6.4 02/03/2018   HGB 15.9 02/03/2018   HCT 47.1 02/03/2018   MCV 82.9 02/03/2018   PLT 207 02/03/2018   Lab Results  Component Value Date   NA 138 02/03/2018   K 3.7 02/03/2018   CO2 27 02/03/2018   GLUCOSE 100 (H) 02/03/2018   BUN 14 02/03/2018   CREATININE 1.10 02/03/2018   CALCIUM 9.2 02/03/2018   ANIONGAP 9 02/03/2018   No results found for: CHOL No results found for: HDL No results found for: LDLCALC No results found for: TRIG No results found for: CHOLHDL No results found for: PRXY5O    Assessment & Plan:   Problem List Items Addressed This Visit    None    Visit Diagnoses    Healthcare maintenance    -  Primary   Relevant Orders   Comprehensive metabolic panel   CBC   Lipid panel   Urinalysis, Routine w reflex microscopic   PSA      No orders of the defined types were placed in this encounter.   Follow-up: Return in about 1 year (around  07/10/2019), or if symptoms worsen or fail to improve.   Pt. Given information on health maintenance and prevention. Advised to stop smoking. Encouraged him to continue exercising. Additional follow up will pend results of today's blood work.

## 2018-07-12 LAB — URINALYSIS, ROUTINE W REFLEX MICROSCOPIC
BILIRUBIN URINE: NEGATIVE
HGB URINE DIPSTICK: NEGATIVE
Ketones, ur: NEGATIVE
Leukocytes,Ua: NEGATIVE
NITRITE: NEGATIVE
RBC / HPF: NONE SEEN (ref 0–?)
Specific Gravity, Urine: >=1.03 — AB (ref 1.000–1.030)
Total Protein, Urine: NEGATIVE
Urine Glucose: NEGATIVE
Urobilinogen, UA: 0.2 (ref 0.0–1.0)
pH: 5.5 (ref 5.0–8.0)

## 2018-12-09 ENCOUNTER — Other Ambulatory Visit: Payer: Self-pay

## 2018-12-09 ENCOUNTER — Ambulatory Visit: Payer: Self-pay | Admitting: *Deleted

## 2018-12-09 ENCOUNTER — Encounter (HOSPITAL_BASED_OUTPATIENT_CLINIC_OR_DEPARTMENT_OTHER): Payer: Self-pay

## 2018-12-09 ENCOUNTER — Emergency Department (HOSPITAL_BASED_OUTPATIENT_CLINIC_OR_DEPARTMENT_OTHER): Payer: 59

## 2018-12-09 ENCOUNTER — Observation Stay (HOSPITAL_BASED_OUTPATIENT_CLINIC_OR_DEPARTMENT_OTHER)
Admission: EM | Admit: 2018-12-09 | Discharge: 2018-12-10 | Disposition: A | Discharge status: Home / Self Care | Payer: 59 | Attending: Family Medicine | Admitting: Family Medicine

## 2018-12-09 DIAGNOSIS — R202 Paresthesia of skin: Secondary | ICD-10-CM

## 2018-12-09 DIAGNOSIS — R200 Anesthesia of skin: Secondary | ICD-10-CM | POA: Diagnosis present

## 2018-12-09 DIAGNOSIS — G459 Transient cerebral ischemic attack, unspecified: Secondary | ICD-10-CM | POA: Diagnosis not present

## 2018-12-09 DIAGNOSIS — I471 Supraventricular tachycardia, unspecified: Secondary | ICD-10-CM | POA: Diagnosis present

## 2018-12-09 DIAGNOSIS — Z79899 Other long term (current) drug therapy: Secondary | ICD-10-CM | POA: Insufficient documentation

## 2018-12-09 DIAGNOSIS — F17210 Nicotine dependence, cigarettes, uncomplicated: Secondary | ICD-10-CM | POA: Diagnosis not present

## 2018-12-09 DIAGNOSIS — Z20828 Contact with and (suspected) exposure to other viral communicable diseases: Secondary | ICD-10-CM | POA: Diagnosis not present

## 2018-12-09 LAB — COMPREHENSIVE METABOLIC PANEL
ALT: 26 U/L (ref 0–44)
AST: 25 U/L (ref 15–41)
Albumin: 4.4 g/dL (ref 3.5–5.0)
Alkaline Phosphatase: 63 U/L (ref 38–126)
Anion gap: 10 (ref 5–15)
BUN: 13 mg/dL (ref 6–20)
CO2: 24 mmol/L (ref 22–32)
Calcium: 9.4 mg/dL (ref 8.9–10.3)
Chloride: 105 mmol/L (ref 98–111)
Creatinine, Ser: 0.95 mg/dL (ref 0.61–1.24)
GFR calc Af Amer: >60 mL/min (ref >60)
GFR calc non Af Amer: >60 mL/min (ref >60)
Glucose, Bld: 101 mg/dL — ABNORMAL HIGH (ref 70–99)
Potassium: 3.7 mmol/L (ref 3.5–5.1)
Sodium: 139 mmol/L (ref 135–145)
Total Bilirubin: 1.5 mg/dL — ABNORMAL HIGH (ref 0.3–1.2)
Total Protein: 7.4 g/dL (ref 6.5–8.1)

## 2018-12-09 LAB — CBC WITH DIFFERENTIAL/PLATELET
Abs Immature Granulocytes: 0.01 10*3/uL (ref 0.00–0.07)
Basophils Absolute: 0 10*3/uL (ref 0.0–0.1)
Basophils Relative: 1 %
Eosinophils Absolute: 0.1 10*3/uL (ref 0.0–0.5)
Eosinophils Relative: 2 %
HCT: 49.1 % (ref 39.0–52.0)
Hemoglobin: 15.4 g/dL (ref 13.0–17.0)
Immature Granulocytes: 0 %
Lymphocytes Relative: 28 %
Lymphs Abs: 1.8 10*3/uL (ref 0.7–4.0)
MCH: 27.8 pg (ref 26.0–34.0)
MCHC: 31.4 g/dL (ref 30.0–36.0)
MCV: 88.6 fL (ref 80.0–100.0)
Monocytes Absolute: 0.5 10*3/uL (ref 0.1–1.0)
Monocytes Relative: 8 %
Neutro Abs: 3.8 10*3/uL (ref 1.7–7.7)
Neutrophils Relative %: 61 %
Platelets: 204 10*3/uL (ref 150–400)
RBC: 5.54 MIL/uL (ref 4.22–5.81)
RDW: 12.6 % (ref 11.5–15.5)
WBC: 6.3 10*3/uL (ref 4.0–10.5)
nRBC: 0 % (ref 0.0–0.2)

## 2018-12-09 LAB — SARS CORONAVIRUS 2 BY RT PCR (HOSPITAL ORDER, PERFORMED IN ~~LOC~~ HOSPITAL LAB): SARS Coronavirus 2: NEGATIVE

## 2018-12-09 LAB — URINALYSIS, ROUTINE W REFLEX MICROSCOPIC
Bilirubin Urine: NEGATIVE
Glucose, UA: NEGATIVE mg/dL
Hgb urine dipstick: NEGATIVE
Ketones, ur: NEGATIVE mg/dL
Leukocytes,Ua: NEGATIVE
Nitrite: NEGATIVE
Protein, ur: NEGATIVE mg/dL
Specific Gravity, Urine: 1.025 (ref 1.005–1.030)
pH: 6 (ref 5.0–8.0)

## 2018-12-09 NOTE — ED Notes (Signed)
[facility] notified (Taryn) - patient ready for transport 

## 2018-12-09 NOTE — ED Triage Notes (Signed)
Pt c/o onset of numbness to left side of neck, left UE and left rib area ~2pm lasting for ~63min while driving-denies at present-denies pain-NAD-steady gait

## 2018-12-09 NOTE — ED Notes (Signed)
Stroke swallow screen done at 1710

## 2018-12-09 NOTE — Telephone Encounter (Signed)
WK-Plz see triage note below/pt advised to go to ED to eval Sx of numbness on left side of neck radiating down left arm lasting 10-15 minutes while driving/thx dmf

## 2018-12-09 NOTE — ED Notes (Signed)
Vital signs stable. 

## 2018-12-09 NOTE — ED Notes (Signed)
Pt. In no distress speaks clearly and has no trouble swallowing.

## 2018-12-09 NOTE — Consult Note (Signed)
[facility] Tele[facility]urology Consult Services  Stat Consult  Date of Service:   12/09/2018 18:51:12  Impression:     .  Transient Ischemic Attack, rule out ischemic stroke - small vessel vs embolic R MCA territory  Comments/Sign-Out: 43 yo M with history of tobacco abuse and PSVT on metoprolol who presents with transient left face arm and trunk numbness that resolved. Symptoms concerning for TIA. Given new onset event advised admission for TIA workup.   Aspirin 81mg  daily for secondary stroke ppx. Routine workup outlined below. Neurology consultation to follow up and give clearance on returning to activity. Smoking cessation advised; asked him to speak to his PCP about smoking cessation aids and he will try.  CT HEAD: Showed No Acute Hemorrhage or Acute Core Infarct TTE 02/2018 was normal except trivial AV and MV regurgitation  Metrics: [facility] Notification Time: 12/09/2018 18:50:39 Stamp Time: 12/09/2018 18:51:12 Callback Response Time: 12/09/2018 18:57:15 Video Start Time: 12/09/2018 19:22:44 Video End Time: 12/09/2018 19:37:47  Our recommendations are outlined below.  Recommendations:     .  Antiplatelet Therapy      .  Initiate Aspirin 81 MG Daily  Imaging Studies:     .  MRI Head without contrast     .  MRA Head and Neck Without Contrast When Available - Stroke Protocol     .  Echocardiogram - Transthoracic Echocardiogram, Cardiac telemetry and cardiac monitor at discharge if negative  Therapies:     .  Physical Therapy, Occupational Therapy, Speech Therapy Assessment When Applicable  Other WorkUp:     .  Check lipid panel A1c. Goal LDL <70, start statin if not below goal.       .  Check Folate and B12 level     .  Check TSH  Disposition: Neurology Follow Up Recommended.  Offer smoking cesation  Sign Out:     .  Discussed with Emergency Department  Provider  ----------------------------------------------------------------------------------------------------  Chief Complaint: Transient left sided numbness  History of Present Illness: Patient is a 43 year old Male.  43 yo M hx tobacco abuse, ocular migraines 1-2x a year and several episodes of PSVT attributed to stress last fall, who presents with 15 minutes of left neck arm and trunk numbness while driving that resolved. He has never had these symptoms before and it was nonprovoked. Currently he feels fine. CT head negative. Vital signs wnl. NIHSS 0.   He did not have a migraine or ocular migraine before during or after the epipsode. He did not lose awareness or have seizure like activity when this happened.   He was having PSVT last fall but attributed to his job related stress and has not recurred. He was prescribed metorpolol but does not take it. He smokes reguarly and has tried to quit but has difficulty. He balances this out with cycling but is concerned now that he had this event. He has no family history of stroke or MI  Examination: BP(121/84), Pulse(83), Blood Glucose(101) 1A: Level of Consciousness - Alert; keenly responsive + 0 1B: Ask Month and Age - Both Questions Right + 0 1C: Blink Eyes & Squeeze Hands - Performs Both Tasks + 0 2: Test Horizontal Extraocular Movements - Normal + 0 3: Test Visual Fields - No Visual Loss + 0 4: Test Facial Palsy (Use Grimace if Obtunded) - Normal symmetry + 0 5A: Test Left Arm Motor Drift - No Drift for 10 Seconds + 0 5B: Test Right Arm Motor Drift - No Drift for  10 Seconds + 0 6A: Test Left Leg Motor Drift - No Drift for 5 Seconds + 0 6B: Test Right Leg Motor Drift - No Drift for 5 Seconds + 0 7: Test Limb Ataxia (FNF/Heel-Shin) - No Ataxia + 0 8: Test Sensation - Normal; No sensory loss + 0 9: Test Language/Aphasia - Normal; No aphasia + 0 10: Test Dysarthria - Normal + 0 11: Test Extinction/Inattention - No abnormality +  0  NIHSS Score: 0  Patient/Family was informed the Neurology Consult would happen via TeleHealth consult by way of interactive audio and video telecommunications and consented to receiving care in this manner.  Due to the immediate potential for life-threatening deterioration due to underlying acute neurologic illness, I spent 35 minutes providing critical care. This time includes time for face to face visit via telemedicine, review of medical records, imaging studies and discussion of findings with providers, the patient and/or family.   Dr Schuyler AmorSam Maelys Kinnick   [facility] [phone]   Case [id number]

## 2018-12-09 NOTE — ED Notes (Signed)
Pt. Has gone to CT via stretcher

## 2018-12-09 NOTE — Telephone Encounter (Signed)
Patient is calling to report he was driving and had numbness that started left- neck and traveled into arm- it lasted for 10-15 minutes. Episode happened an hour ago- advised patient due to symptoms- ned to go to ED for evaluation of symptoms.  Reason for Disposition . [1] Numbness (i.e., loss of sensation) of the face, arm / hand, or leg / foot on one side of the body AND [2] sudden onset AND [3] brief (now gone)  Answer Assessment - Initial Assessment Questions 1. SYMPTOM: "What is the main symptom you are concerned about?" (e.g., weakness, numbness)     Numbness- traveled from neck, shoulder to arm- left side 2. ONSET: "When did this start?" (minutes, hours, days; while sleeping)     Lasted 10-15 minutes 3. LAST NORMAL: "When was the last time you were normal (no symptoms)?"     After 10-15 minutes everything returned to normal- 2:15 pm  4. PATTERN "Does this come and go, or has it been constant since it started?"  "Is it present now?"     Comes and goes- first time this has happened 5. CARDIAC SYMPTOMS: "Have you had any of the following symptoms: chest pain, difficulty breathing, palpitations?"     no 6. NEUROLOGIC SYMPTOMS: "Have you had any of the following symptoms: headache, dizziness, vision loss, double vision, changes in speech, unsteady on your feet?"     no 7. OTHER SYMPTOMS: "Do you have any other symptoms?"     no 8. PREGNANCY: "Is there any chance you are pregnant?" "When was your last menstrual period?"     n/a  Protocols used: NEUROLOGIC DEFICIT-A-AH

## 2018-12-09 NOTE — Care Management (Signed)
This is a no charge note  Transfer from [facility] per Dr. 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  CT head negative for acute intracranial abnormalities..  Telemetry neurology was consulted, Dr. Leda Quail recommended to do TIA work-up per ED physician.  Patient was found to have WBC 6.3, pending COVID-19 test, electrolytes renal function okay, temperature normal, blood pressure 121/84, heart rate 83, RR 18, oxygen sat 99% on room air.  Patient is placed on telemetry bed for observation.   Please call manager of [facility] at [phone] when pt arrives to floor   Ivor Costa, MD  [facility]   If 7PM-7AM, please contact night-coverage [email or web address] Password [email or web address] 12/09/2018, 8:04 PM

## 2018-12-09 NOTE — ED Notes (Signed)
Pt on monitor 

## 2018-12-09 NOTE — ED Provider Notes (Signed)
[facility] EMERGENCY DEPARTMENT Provider Note   CSN: [id number] Arrival date & time: 12/09/18  1620    History   Chief Complaint Chief Complaint  Patient presents with  . Numbness    HPI Brandon [name] is a 43 y.o. male.     The history is provided by the patient and medical records. No language interpreter was used.   Brandon [name] is a 43 y.o. male who presents to the Emergency Department complaining of numbness. He presents to the emergency department complaining of numbness to his left neck, left upper extremity and left trunk that began about 215 today. This began when [place] [employer]. His symptoms lasted about 15 minutes and then completely resolved. He has no medical problems and takes no medications. No family history of stroke. He is a smoker, drinks occasional alcohol, no street drug use. He denies any associated fevers, chest pain, shortness of breath, weakness, diaphoresis. Past Medical History:  Diagnosis Date  . Chest pain 01/28/2018  . Palpitation   . Right knee pain     Patient Active Problem List   Diagnosis Date Noted  . TIA (transient ischemic attack) 12/09/2018  . Left sided numbness 12/09/2018  . Healthcare maintenance 07/10/2018  . PSVT (paroxysmal supraventricular tachycardia) (HCC) 06/19/2018  . Chest pain 01/28/2018  . Right knee pain 08/21/2017    Past Surgical History:  Procedure Laterality Date  . PATELLA FRACTURE SURGERY          Home Medications    Prior to Admission medications   Medication Sig Start Date End Date Taking? Authorizing Provider  hydrOXYzine (ATARAX/VISTARIL) 25 MG tablet Take 1 tablet (25 mg total) by mouth every 6 (six) hours. 03/05/18   Arthor CaptainHarris, Abigail, PA-C  metoprolol tartrate (LOPRESSOR) 25 MG tablet Take 1 tablet (25 mg total) by mouth 2 (two) times daily as needed. for palpitations Patient not taking: Reported on 07/10/2018 06/19/18   Abelino DerrickKilroy, Luke K, PA-C  mupirocin ointment (BACTROBAN) 2 % Place  1 application into the nose 2 (two) times daily. To the affected are and inside the nose 03/05/18   Arthor CaptainHarris, Abigail, PA-C  nitroGLYCERIN (NITRODUR - DOSED IN MG/24 HR) 0.2 mg/hr patch APPLY 1/4TH PATCH TO AFFECTED KNEE, CHANGE DAILY 11/23/17   Hudnall, Azucena FallenShane R, MD    Family History Family History  Problem Relation Age of Onset  . Cancer Mother        ESOPHAGEAL  . Diabetes Father   . Heart Problems Father        CARDIOMEGALY  . Healthy Brother     Social History Social History   Tobacco Use  . Smoking status: Current Every Day Smoker    Packs/day: 0.50    Types: Cigarettes  . Smokeless tobacco: Never Used  Substance Use Topics  . Alcohol use: Yes    Comment: rare  . Drug use: Yes    Types: Marijuana     Allergies   Patient has no known allergies.   Review of Systems Review of Systems  All other systems reviewed and are negative.    Physical Exam Updated Vital Signs BP 122/88 (BP Location: Left Arm)   Pulse (!) 52   Temp 98.3 F (36.8 C) (Oral)   Resp 15   Ht 6' (1.829 m)   Wt 81.6 kg   SpO2 100%   BMI 24.41 kg/m   Physical Exam Vitals signs and nursing note reviewed.  Constitutional:      Appearance: He is well-developed.  HENT:     Head: Normocephalic and atraumatic.  Cardiovascular:     Rate and Rhythm: Normal rate and regular rhythm.  Pulmonary:     Effort: Pulmonary effort is normal. No respiratory distress.  Abdominal:     Palpations: Abdomen is soft.     Tenderness: There is no abdominal tenderness. There is no guarding or rebound.  Musculoskeletal:        General: No swelling or tenderness.     Comments: 2+ radial and DP pulses bilaterally  Skin:    General: Skin is warm and dry.  Neurological:     Mental Status: He is alert and oriented to person, place, and time.     Comments: Visual fields are grossly intact. No asymmetry of facial movements. No pronated or drift. Five out of five strength in all four extremities with sensation to  light touch intact in all four extremities  Psychiatric:        Mood and Affect: Mood normal.        Behavior: Behavior normal.      ED Treatments / Results  Labs (all labs ordered are listed, but only abnormal results are displayed) Labs Reviewed  COMPREHENSIVE METABOLIC PANEL - Abnormal; Notable for the following components:      Result Value   Glucose, Bld 101 (*)    Total Bilirubin 1.5 (*)    All other components within normal limits  SARS CORONAVIRUS 2 (HOSPITAL ORDER, PERFORMED IN [facility])  CBC WITH DIFFERENTIAL/PLATELET  URINALYSIS, ROUTINE W REFLEX MICROSCOPIC    EKG EKG Interpretation  Date/Time:  Monday December 09 2018 16:49:56 EDT Ventricular Rate:  63 PR Interval:    QRS Duration: 87 QT Interval:  392 QTC Calculation: 402 R Axis:   52 Text Interpretation:  Sinus rhythm Probable anteroseptal infarct, recent Confirmed by Tilden Fossaees, Icy Fuhrmann [phone]([id number]) on 12/09/2018 5:20:21 PM   Radiology Ct Head Wo Contrast  Result Date: 12/09/2018 CLINICAL DATA:  Left upper extremity numbness EXAM: CT HEAD WITHOUT CONTRAST TECHNIQUE: Contiguous axial images were obtained from the base of the skull through the vertex without intravenous contrast. COMPARISON:  None. FINDINGS: Brain: No evidence of acute infarction, hemorrhage, hydrocephalus, extra-axial collection or mass lesion/mass effect. Vascular: No hyperdense vessel or unexpected calcification. Skull: Normal. Negative for fracture or focal lesion. Sinuses/Orbits: No acute finding. Other: None IMPRESSION: Negative non contrasted CT appearance of the brain Electronically Signed   By: Jasmine PangKim  Fujinaga M.D.   On: 12/09/2018 18:39    Procedures Procedures (including critical care time)  Medications Ordered in ED Medications - No data to display   Initial Impression / Assessment and Plan / ED Course  I have reviewed the triage vital signs and the nursing notes.  Pertinent labs & imaging results that were available during  my care of the patient were reviewed by me and considered in my medical decision making (see chart for details).        Patient here for evaluation of paresthesias to the left neck, arm, and trunk that lasted about 15 minutes. He is asymptomatic on ED evaluation with no focal neurologic deficits. Imaging is negative for acute abnormality. Tele-neurology consulted and he was evaluated, with recommendation for TIA workup. Discussed with patient recommendations from neurologist and he is in agreement with treatment plan. Hospitalist consulted for observation and TIA workup.  Final Clinical Impressions(s) / ED Diagnoses   Final diagnoses:  Paresthesia    ED Discharge Orders    None  Quintella Reichert, MD 12/09/18 2315

## 2018-12-09 NOTE — ED Notes (Signed)
Pt. Is eating a snack and drinking a drink

## 2018-12-10 ENCOUNTER — Observation Stay (HOSPITAL_COMMUNITY): Payer: 59

## 2018-12-10 ENCOUNTER — Observation Stay (HOSPITAL_BASED_OUTPATIENT_CLINIC_OR_DEPARTMENT_OTHER): Payer: 59

## 2018-12-10 DIAGNOSIS — I471 Supraventricular tachycardia: Secondary | ICD-10-CM | POA: Diagnosis not present

## 2018-12-10 DIAGNOSIS — G459 Transient cerebral ischemic attack, unspecified: Secondary | ICD-10-CM

## 2018-12-10 DIAGNOSIS — R200 Anesthesia of skin: Secondary | ICD-10-CM | POA: Diagnosis not present

## 2018-12-10 DIAGNOSIS — R202 Paresthesia of skin: Secondary | ICD-10-CM

## 2018-12-10 LAB — LIPID PANEL
Cholesterol: 206 mg/dL — ABNORMAL HIGH (ref 0–200)
HDL: 36 mg/dL — ABNORMAL LOW (ref >40)
LDL Cholesterol: 122 mg/dL — ABNORMAL HIGH (ref 0–99)
Total CHOL/HDL Ratio: 5.7 RATIO
Triglycerides: 242 mg/dL — ABNORMAL HIGH (ref <150)
VLDL: 48 mg/dL — ABNORMAL HIGH (ref 0–40)

## 2018-12-10 LAB — ECHOCARDIOGRAM COMPLETE
Height: 72 in
Weight: 2880 oz

## 2018-12-10 LAB — HEMOGLOBIN A1C
Hgb A1c MFr Bld: 4.5 % — ABNORMAL LOW (ref 4.8–5.6)
Mean Plasma Glucose: 82.45 mg/dL

## 2018-12-10 LAB — VITAMIN B12: Vitamin B-12: 360 pg/mL (ref 180–914)

## 2018-12-10 LAB — TSH: TSH: 1.679 u[IU]/mL (ref 0.350–4.500)

## 2018-12-10 LAB — HIV ANTIBODY (ROUTINE TESTING W REFLEX): HIV Screen 4th Generation wRfx: NONREACTIVE

## 2018-12-10 MED ORDER — ASPIRIN 325 MG PO TABS: 325.0000 mg | ORAL_TABLET | Freq: Every day | ORAL | 0 refills | Status: DC

## 2018-12-10 MED ORDER — ACETAMINOPHEN 160 MG/5ML PO SOLN: 650.0000 mg | ORAL | Status: DC | PRN

## 2018-12-10 MED ORDER — ACETAMINOPHEN 325 MG PO TABS: 650.0000 mg | ORAL_TABLET | ORAL | Status: DC | PRN

## 2018-12-10 MED ORDER — ENOXAPARIN SODIUM 40 MG/0.4ML ~~LOC~~ SOLN
40.0000 mg | SUBCUTANEOUS | Status: DC
  Administered 2018-12-10: 01:00:00 40 mg via SUBCUTANEOUS
  Filled 2018-12-10: qty 0.4

## 2018-12-10 MED ORDER — ATORVASTATIN CALCIUM 40 MG PO TABS: 40.0000 mg | ORAL_TABLET | Freq: Every day | ORAL | Status: DC

## 2018-12-10 MED ORDER — STROKE: EARLY STAGES OF RECOVERY BOOK
Freq: Once | Status: AC
  Administered 2018-12-10: 01:00:00
  Filled 2018-12-10: qty 1

## 2018-12-10 MED ORDER — ACETAMINOPHEN 650 MG RE SUPP: 650.0000 mg | RECTAL | Status: DC | PRN

## 2018-12-10 MED ORDER — ASPIRIN 325 MG PO TABS
325.0000 mg | ORAL_TABLET | Freq: Every day | ORAL | Status: DC
  Administered 2018-12-10: 325 mg via ORAL
  Filled 2018-12-10: qty 1

## 2018-12-10 MED ORDER — ASPIRIN 300 MG RE SUPP: 300.0000 mg | Freq: Every day | RECTAL | Status: DC

## 2018-12-10 MED ORDER — ATORVASTATIN CALCIUM 40 MG PO TABS: 40.0000 mg | ORAL_TABLET | Freq: Every day | ORAL | 1 refills | Status: DC

## 2018-12-10 NOTE — Progress Notes (Signed)
  Echocardiogram 2D Echocardiogram has been performed.  Brandon [name] 12/10/2018, 9:57 AM

## 2018-12-10 NOTE — Progress Notes (Signed)
PT Cancellation Note  Patient Details Name: Ryzen Deady MRN: [id number] DOB: 17-May-1975   Cancelled Treatment:    Reason Eval/Treat Not Completed: Patient at procedure or test/unavailable  Patietn working with Speech Therapist. Will attempt later today  Rexanne Mano, PT 12/10/2018, 10:04 AM

## 2018-12-10 NOTE — Progress Notes (Signed)
Patient being discharged home. Education and instructions provided to patient. IV removed. CCMD notified. All belongings with patient. Patient leaving unit with NT.

## 2018-12-10 NOTE — Evaluation (Signed)
Speech Language Pathology Evaluation Patient Details Name: Brandon [name] MRN: [id number] DOB: [birth year] Today's Date: 12/10/2018 Time: 2671-2458 SLP Time Calculation (min) (ACUTE ONLY): 14 min  Problem List:  Patient Active Problem List   Diagnosis Date Noted  . TIA (transient ischemic attack) 12/09/2018  . Left sided numbness 12/09/2018  . Healthcare maintenance 07/10/2018  . PSVT (paroxysmal supraventricular tachycardia) (Story) 06/19/2018  . Chest pain 01/28/2018  . Right knee pain 08/21/2017   Past Medical History:  Past Medical History:  Diagnosis Date  . Chest pain 01/28/2018  . Palpitation   . Right knee pain    Past Surgical History:  Past Surgical History:  Procedure Laterality Date  . PATELLA FRACTURE SURGERY     HPI:  Pt is a 43 y.o. male with medical history significant of tobacco abuse, PSVT who presented to the ED with left face, arm and trunk numbness. MRI of the brain was negative for acute changes.    Assessment / Plan / Recommendation Clinical Impression  Pt reported that he was independent prior to admission and was employed as an Forensic scientist and Mining engineer. [facility] Cognitive Assessment 8.1 was completed to evaluate the pt's cognitive-linguistic skills. He achieved a score of 30/30 which is within the normal limits of 26 or more out of 30 and no speech/language deficits were demonstrated. Further skilled SLP services are not clinically indicated at this time. Pt and nursing were educated regarding this and both parties verbalized understanding as well as agreement with plan of care.    SLP Assessment  SLP Recommendation/Assessment: Patient does not need any further Speech Lanaguage Pathology Services SLP Visit Diagnosis: Cognitive communication deficit (R41.841)    Follow Up Recommendations  None    Frequency and Duration           SLP Evaluation Cognition  Overall Cognitive Status: Within Functional Limits for tasks assessed Arousal/Alertness:  Awake/alert Orientation Level: Oriented X4 Attention: Focused;Sustained Focused Attention: Appears intact(Vigilance WNL: 1/1) Sustained Attention: Appears intact(Serial 7s: 3/3) Memory: Appears intact(Immediate: 5/5; delayed: 5/5) Awareness: Appears intact Problem Solving: Appears intact Executive Function: Reasoning;Sequencing;Organizing Reasoning: Appears intact(Abstraction: 2/2) Sequencing: Appears intact(Clock drawing: 3/3) Organizing: Appears intact(Backward digit span: 1/1)       Comprehension  Auditory Comprehension Overall Auditory Comprehension: Appears within functional limits for tasks assessed Yes/No Questions: Within Functional Limits Commands: Within Functional Limits Conversation: Complex(Trail completion: 1/1) Visual Recognition/Discrimination Discrimination: Within Function Limits Reading Comprehension Reading Status: Within funtional limits    Expression Expression Primary Mode of Expression: Verbal Verbal Expression Overall Verbal Expression: Appears within functional limits for tasks assessed Initiation: No impairment Level of Generative/Spontaneous Verbalization: Conversation Repetition: No impairment(2/2) Naming: No impairment Confrontation: Within functional limits(3/3) Pragmatics: No impairment   Oral / Motor  Oral Motor/Sensory Function Overall Oral Motor/Sensory Function: Within functional limits Motor Speech Overall Motor Speech: Appears within functional limits for tasks assessed Respiration: Within functional limits Phonation: Normal Resonance: Within functional limits Articulation: Within functional limitis Intelligibility: Intelligible Motor Planning: Witnin functional limits Motor Speech Errors: Not applicable   Brandon [name] I. Brandon [name], Brandon [name], Brandon [name] Office number [phone] Pager [phone]                    Brandon [name] 12/10/2018, 10:16 AM

## 2018-12-10 NOTE — Evaluation (Signed)
Physical Therapy Evaluation and Discharge Patient Details Name: Brandon [name] MRN: [id number] DOB: Apr 15, 1976 Today's Date: 12/10/2018   History of Present Illness  43 y.o. male with medical history significant of tobacco abuse, PSVT last fall now on metoprolol.  Patient presents to the ED with left face, arm and trunk numbness. CT head neg, Tele neuro consulted and recd stroke work up.  Patient transferred to [facility].  Clinical Impression   Patient evaluated by Physical Therapy with no further acute PT needs identified. All education has been completed and the patient has no further questions. Orthostatic vitals assessed and WNL (see flowsheet). PT is signing off. Thank you for this referral.     Follow Up Recommendations No PT follow up    Equipment Recommendations  None recommended by PT    Recommendations for Other Services       Precautions / Restrictions Precautions Precautions: None      Mobility  Bed Mobility Overal bed mobility: Independent                Transfers Overall transfer level: Independent                  Ambulation/Gait Ambulation/Gait assistance: Independent Gait Distance (Feet): 200 Feet Assistive device: None Gait Pattern/deviations: WFL(Within Functional Limits)   Gait velocity interpretation: >4.37 ft/sec, indicative of normal walking speed    Stairs Stairs: Yes Stairs assistance: Independent Stair Management: No rails Number of Stairs: 2    Wheelchair Mobility    Modified Rankin (Stroke Patients Only) Modified Rankin (Stroke Patients Only) Pre-Morbid Rankin Score: No symptoms Modified Rankin: No symptoms     Balance Overall balance assessment: Independent                               Standardized Balance Assessment Standardized Balance Assessment : Dynamic Gait Index   Dynamic Gait Index Level Surface: Normal Change in Gait Speed: Normal Gait with Horizontal Head Turns: Normal Gait with Vertical  Head Turns: Normal Gait and Pivot Turn: Normal Step Over Obstacle: Normal Step Around Obstacles: Normal Steps: Normal Total Score: 24       Pertinent Vitals/Pain Pain Assessment: No/denies pain    Home Living Family/patient expects to be discharged to:: Private residence Living Arrangements: Spouse/significant other Available Help at Discharge: Family;Available 24 hours/day Type of Home: House           Additional Comments: at night drives for [facility]    Prior Function Level of Independence: Independent               Hand Dominance        Extremity/Trunk Assessment   Upper Extremity Assessment Upper Extremity Assessment: Defer to OT evaluation    Lower Extremity Assessment Lower Extremity Assessment: Overall WFL for tasks assessed    Cervical / Trunk Assessment Cervical / Trunk Assessment: Normal  Communication   Communication: No difficulties  Cognition Arousal/Alertness: Awake/alert Behavior During Therapy: WFL for tasks assessed/performed Overall Cognitive Status: Within Functional Limits for tasks assessed                                        General Comments      Exercises     Assessment/Plan    PT Assessment Patent does not need any further PT services  PT Problem List  PT Treatment Interventions      PT Goals (Current goals can be found in the Care Plan section)  Acute Rehab PT Goals PT Goal Formulation: All assessment and education complete, DC therapy    Frequency     Barriers to discharge        Co-evaluation               AM-PAC PT "6 Clicks" Mobility  Outcome Measure Help needed turning from your back to your side while in a flat bed without using bedrails?: None Help needed moving from lying on your back to sitting on the side of a flat bed without using bedrails?: None Help needed moving to and from a bed to a chair (including a wheelchair)?: None Help needed standing up from a chair  using your arms (e.g., wheelchair or bedside chair)?: None Help needed to walk in hospital room?: None Help needed climbing 3-5 steps with a railing? : None 6 Click Score: 24    End of Session   Activity Tolerance: Patient tolerated treatment well Patient left: in bed;with bed alarm set;with call bell/phone within reach Nurse Communication: Mobility status;Other (comment)(feel he could have bed alarm off & up independently) PT Visit Diagnosis: Other abnormalities of gait and mobility (R26.89)    Time: 1020-1036 PT Time Calculation (min) (ACUTE ONLY): 16 min   Charges:   PT Evaluation $PT Eval Low Complexity: 1 Low            Veda Canning, PT      [employer] 12/10/2018, 10:44 AM

## 2018-12-10 NOTE — TOC Transition Note (Signed)
Transition of Care [facility]) - CM/SW Discharge Note   Patient Details  Name: Brandon [name] MRN: [id number] Date of Birth: 03-Sep-1975  Transition of Care [facility]) CM/SW Contact:  Pollie Friar, RN Phone Number: 12/10/2018, 2:33 PM   Clinical Narrative:    Pt discharging home with self care. Pt has PCP, insurance and transportation home.   Final next level of care: Home/Self Care Barriers to Discharge: No Barriers Identified   Patient Goals and CMS Choice        Discharge Placement                       Discharge Plan and Services                                     Social Determinants of Health (SDOH) Interventions     Readmission Risk Interventions No flowsheet data found.

## 2018-12-10 NOTE — H&P (Signed)
History and Physical    Brandon [name] QIH:[id number] DOB: [birth year] DOA: 12/09/2018  PCP: Libby Maw, MD  Patient coming from: Home  I have personally briefly reviewed patient's old medical records in [place]  Chief Complaint: L sided numbness  HPI: Brandon [name] is a 43 y.o. male with medical history significant of tobacco abuse, PSVT last fall now on metoprolol.  Patient presents to the ED with left face, arm and trunk numbness.  Symptoms onset at about 2pm.  Symptoms improved some but still some residual numbness per patient.  No recurrence of PSVT symptoms since last fall.  Prescribed metoprolol but doesn't take it.   ED Course: CT head neg, Tele neuro consulted and recd stroke work up.  Patient transferred to [facility].   Review of Systems: As per HPI, otherwise all review of systems negative.  Past Medical History:  Diagnosis Date  . Chest pain 01/28/2018  . Palpitation   . Right knee pain     Past Surgical History:  Procedure Laterality Date  . PATELLA FRACTURE SURGERY       reports that he has been smoking cigarettes. He has been smoking about 0.50 packs per day. He has never used smokeless tobacco. He reports current alcohol use. He reports current drug use. Drug: Marijuana.  No Known Allergies  Family History  Problem Relation Age of Onset  . Cancer Mother        ESOPHAGEAL  . Diabetes Father   . Heart Problems Father        CARDIOMEGALY  . Healthy Brother      Prior to Admission medications   Medication Sig Start Date End Date Taking? Authorizing Provider  hydrOXYzine (ATARAX/VISTARIL) 25 MG tablet Take 1 tablet (25 mg total) by mouth every 6 (six) hours. 03/05/18   Margarita Mail, PA-C  metoprolol tartrate (LOPRESSOR) 25 MG tablet Take 1 tablet (25 mg total) by mouth 2 (two) times daily as needed. for palpitations Patient not taking: Reported on 07/10/2018 06/19/18   Erlene Quan, PA-C  mupirocin ointment (BACTROBAN) 2 % Place 1  application into the nose 2 (two) times daily. To the affected are and inside the nose 03/05/18   Margarita Mail, PA-C  nitroGLYCERIN (NITRODUR - DOSED IN MG/24 HR) 0.2 mg/hr patch APPLY 1/4TH PATCH TO AFFECTED KNEE, CHANGE DAILY 11/23/17   Dene Gentry, MD    Physical Exam: Vitals:   12/09/18 1701 12/09/18 1801 12/09/18 2154 12/10/18 0008  BP: 121/73 121/84 122/88 121/85  Pulse: 68 83 (!) 52 (!) 57  Resp: 17 16 15 15   Temp:   98.3 F (36.8 C) 98 F (36.7 C)  TempSrc:   Oral Oral  SpO2: 99% 99% 100% 100%  Weight:      Height:        Constitutional: NAD, calm, comfortable Eyes: PERRL, lids and conjunctivae normal ENMT: Mucous membranes are moist. Posterior pharynx clear of any exudate or lesions.Normal dentition.  Neck: normal, supple, no masses, no thyromegaly Respiratory: clear to auscultation bilaterally, no wheezing, no crackles. Normal respiratory effort. No accessory muscle use.  Cardiovascular: Regular rate and rhythm, no murmurs / rubs / gallops. No extremity edema. 2+ pedal pulses. No carotid bruits.  Abdomen: no tenderness, no masses palpated. No hepatosplenomegaly. Bowel sounds positive.  Musculoskeletal: no clubbing / cyanosis. No joint deformity upper and lower extremities. Good ROM, no contractures. Normal muscle tone.  Skin: no rashes, lesions, ulcers. No induration Neurologic: CN 2-12 grossly intact. Sensation intact, DTR normal.  Strength 5/5 in all 4.  Psychiatric: Normal judgment and insight. Alert and oriented x 3. Normal mood.    Labs on Admission: I have personally reviewed following labs and imaging studies  CBC: Recent Labs  Lab 12/09/18 1802  WBC 6.3  NEUTROABS 3.8  HGB 15.4  HCT 49.1  MCV 88.6  PLT 204   Basic Metabolic Panel: Recent Labs  Lab 12/09/18 1802  NA 139  K 3.7  CL 105  CO2 24  GLUCOSE 101*  BUN 13  CREATININE 0.95  CALCIUM 9.4   GFR: Estimated Creatinine Clearance: 108.9 mL/min (by C-G formula based on SCr of 0.95  mg/dL). Liver Function Tests: Recent Labs  Lab 12/09/18 1802  AST 25  ALT 26  ALKPHOS 63  BILITOT 1.5*  PROT 7.4  ALBUMIN 4.4   No results for input(s): LIPASE, AMYLASE in the last 168 hours. No results for input(s): AMMONIA in the last 168 hours. Coagulation Profile: No results for input(s): INR, PROTIME in the last 168 hours. Cardiac Enzymes: No results for input(s): CKTOTAL, CKMB, CKMBINDEX, TROPONINI in the last 168 hours. BNP (last 3 results) No results for input(s): PROBNP in the last 8760 hours. HbA1C: No results for input(s): HGBA1C in the last 72 hours. CBG: No results for input(s): GLUCAP in the last 168 hours. Lipid Profile: No results for input(s): CHOL, HDL, LDLCALC, TRIG, CHOLHDL, LDLDIRECT in the last 72 hours. Thyroid Function Tests: No results for input(s): TSH, T4TOTAL, FREET4, T3FREE, THYROIDAB in the last 72 hours. Anemia Panel: No results for input(s): VITAMINB12, FOLATE, FERRITIN, TIBC, IRON, RETICCTPCT in the last 72 hours. Urine analysis:    Component Value Date/Time   COLORURINE YELLOW 12/09/2018 1802   APPEARANCEUR CLEAR 12/09/2018 1802   LABSPEC 1.025 12/09/2018 1802   PHURINE 6.0 12/09/2018 1802   GLUCOSEU NEGATIVE 12/09/2018 1802   GLUCOSEU NEGATIVE 07/10/2018 1034   HGBUR NEGATIVE 12/09/2018 1802   BILIRUBINUR NEGATIVE 12/09/2018 1802   KETONESUR NEGATIVE 12/09/2018 1802   PROTEINUR NEGATIVE 12/09/2018 1802   UROBILINOGEN 0.2 07/10/2018 1034   NITRITE NEGATIVE 12/09/2018 1802   LEUKOCYTESUR NEGATIVE 12/09/2018 1802    Radiological Exams on Admission: Ct Head Wo Contrast  Result Date: 12/09/2018 CLINICAL DATA:  Left upper extremity numbness EXAM: CT HEAD WITHOUT CONTRAST TECHNIQUE: Contiguous axial images were obtained from the base of the skull through the vertex without intravenous contrast. COMPARISON:  None. FINDINGS: Brain: No evidence of acute infarction, hemorrhage, hydrocephalus, extra-axial collection or mass lesion/mass effect.  Vascular: No hyperdense vessel or unexpected calcification. Skull: Normal. Negative for fracture or focal lesion. Sinuses/Orbits: No acute finding. Other: None IMPRESSION: Negative non contrasted CT appearance of the brain Electronically Signed   By: Jasmine PangKim  Fujinaga M.D.   On: 12/09/2018 18:39    EKG: Independently reviewed.  Assessment/Plan Principal Problem:   TIA (transient ischemic attack) Active Problems:   PSVT (paroxysmal supraventricular tachycardia) (HCC)   Left sided numbness    1. TIA / stroke - 1. Stroke pathway 2. CXR, 2d echo 3. MRI brain (neuro consult if positive) 4. Carotid dopplers 5. Tele monitor 6. ASA for now 7. A1C, FLP 2. H/o PSVT - 1. Though there was some question of A.Flutter last fall, it was ultimately judged by cardiology to likely represent PSVT. 2. Prescribed metoprolol but doesn't take it 3. No recurrence of symptoms since last fall. 4. IF MRI brain comes back positive for stroke, then would need to re-visit this (and make decision regarding anticoagulation).  DVT prophylaxis: Lovenox Code  Status: Full Family Communication: No family in room Disposition Plan: Home after admit Consults called: None Admission status: Place in 29obs    GARDNER, JARED M. DO [facility]  How to contact the [facility] Attending or Consulting provider 7A - 7P or covering provider during after hours 7P -7A, for this patient?  1. Check the care team in [facility] and look for a) attending/consulting [facility] provider listed and b) the [facility] team listed 2. Log into [email or web address]  Amion Physician Scheduling and messaging for groups and whole hospitals  On call and physician scheduling software for group practices, residents, hospitalists and other medical providers for call, clinic, rotation and shift schedules. OnCall Enterprise is a hospital-wide system for scheduling doctors and paging doctors on call. EasyPlot is for scientific plotting and data analysis.  [email or web address]  and use  [employer]'s universal password to access. If you do not have the password, please contact the hospital operator.  3. Locate the [facility] provider you are looking for under [facility] and page to a number that you can be directly reached. 4. If you still have difficulty reaching the provider, please page the [facility] (Director on Call) for the Hospitalists listed on amion for assistance.  12/10/2018, 1:08 AM

## 2018-12-10 NOTE — Discharge Summary (Signed)
Physician Discharge Summary [facility]    Patient: Brandon [name]                   Admit date: 12/09/2018   DOB: Sep 22, 1975             Discharge date:12/10/2018/12:40 PM WUJ:[id number]                          PCP: Mliss Sax, MD  Disposition: Home   Recommendations for Outpatient Follow-up:    Follow up: in 2 week  Discharge Condition: Stable   Code Status:   Code Status: Full Code  Diet recommendation: Cardiac diet   Discharge Diagnoses:    Principal Problem:   TIA (transient ischemic attack) Active Problems:   PSVT (paroxysmal supraventricular tachycardia) (HCC)   Left sided numbness   History of Present Illness/ Hospital Course Charline Bills Summary:    Chief Complaint: L sided numbness  HPI: Brandon [name] is a 43 y.o. male with medical history significant of tobacco abuse, PSVT last fall now on metoprolol.  Patient presents to the ED with left face, arm and trunk numbness.  Symptoms onset at about 2pm.  Symptoms improved some but still some residual numbness per patient.  No recurrence of PSVT symptoms since last fall.  Prescribed metoprolol but doesn't take it.  ED Course: CT head neg, Tele neuro consulted and recd stroke work up.  Patient transferred to [facility].  Discharge summary/hospital course: TIA/CVA work-up Patient was subsequently admitted to monitored bed on close observation under TIA/stroke rule out under recommendation of telemetry neurology: -Patient has completed CT of the head/MRI of the brain which was within normal limits, bilateral carotid studies were negative, -2D echocardiogram was reviewed -no acute defect details as below -At this time CVA has been ruled questionable TIA -Patient is to continue aspirin 81 mg daily, and statins. Hyperlipidemia -statins... Initiated Lipitor 40 mg daily -Total cholesterol 206, HDL 36, LDL 122, triglyceride 242, -Instructed to follow-up with his PCP, monitoring LFTs, repeating fasting lipid  panel in 6-8 weeks.  History of SVT -Patient was actually slightly bradycardic 49-55 on admission, currently HR 63 -We recommend discontinue his beta-blocker  Disposition -Patient is cleared to discharge home  2D echocardiogram: IMPRESSIONS    1. The left ventricle has normal systolic function, with an ejection fraction of 55-60%. The cavity size was normal. Left ventricular diastolic parameters were normal. No evidence of left ventricular regional wall motion abnormalities.  2. The right ventricle has normal systolic function. The cavity was normal. There is no increase in right ventricular wall thickness. Right ventricular systolic pressure could not be assessed.  3. The aorta is normal in size and structure.  4. The inferior vena cava was dilated in size with >50% respiratory variability.  Discharge Instructions:   Discharge Instructions    Activity as tolerated - No restrictions   Complete by: As directed    Diet - low sodium heart healthy   Complete by: As directed    Discharge instructions   Complete by: As directed    Follow-up with your PCP, and neck 6- 8 weeks to monitor your liver function, continue cholesterol medication, your cholesterol level is to be rechecked in next 6 to 8 weeks.   Increase activity slowly   Complete by: As directed        Medication List    STOP taking these medications   hydrOXYzine 25 MG tablet Commonly known as: ATARAX/VISTARIL  metoprolol tartrate 25 MG tablet Commonly known as: LOPRESSOR     TAKE these medications   aspirin 325 MG tablet Take 1 tablet (325 mg total) by mouth daily. Start taking on: December 11, 2018   atorvastatin 40 MG tablet Commonly known as: LIPITOR Take 1 tablet (40 mg total) by mouth daily at 6 PM.   mupirocin ointment 2 % Commonly known as: Bactroban Place 1 application into the nose 2 (two) times daily. To the affected are and inside the nose   nitroGLYCERIN 0.2 mg/hr patch Commonly known as:  NITRODUR - Dosed in mg/24 hr APPLY 1/4TH PATCH TO AFFECTED KNEE, CHANGE DAILY       No Known Allergies   Procedures /Studies:   Dg Chest 2 View  Result Date: 12/10/2018 CLINICAL DATA:  Transient ischemic attack.  Cigarette smoker. EXAM: CHEST - 2 VIEW COMPARISON:  PA and lateral chest 01/24/2018. FINDINGS: The lungs are clear. There is distortion of the pulmonary architecture and some bullous change in the apices. No pneumothorax or pleural effusion. Heart size is normal. No acute or focal bony abnormality. IMPRESSION: No acute disease. Emphysema. Electronically Signed   By: Drusilla Kanner M.D.   On: 12/10/2018 07:31   Ct Head Wo Contrast  Result Date: 12/09/2018 CLINICAL DATA:  Left upper extremity numbness EXAM: CT HEAD WITHOUT CONTRAST TECHNIQUE: Contiguous axial images were obtained from the base of the skull through the vertex without intravenous contrast. COMPARISON:  None. FINDINGS: Brain: No evidence of acute infarction, hemorrhage, hydrocephalus, extra-axial collection or mass lesion/mass effect. Vascular: No hyperdense vessel or unexpected calcification. Skull: Normal. Negative for fracture or focal lesion. Sinuses/Orbits: No acute finding. Other: None IMPRESSION: Negative non contrasted CT appearance of the brain Electronically Signed   By: Jasmine Pang M.D.   On: 12/09/2018 18:39   Mr Brain Wo Contrast  Result Date: 12/10/2018 CLINICAL DATA:  TIA due to numbness in the left neck and upper extremity. EXAM: MRI HEAD WITHOUT CONTRAST TECHNIQUE: Multiplanar, multiecho pulse sequences of the brain and surrounding structures were obtained without intravenous contrast. COMPARISON:  Head CT from yesterday FINDINGS: Brain: No acute infarction, hemorrhage, hydrocephalus, extra-axial collection or mass lesion. Vascular: Normal flow voids. Skull and upper cervical spine: Normal marrow signal. Sinuses/Orbits: Negative. IMPRESSION: Normal brain MRI. Electronically Signed   By: Marnee Spring M.D.    On: 12/10/2018 06:32   Vas US Carotid (at [facility])  Result Date: 12/10/2018 Carotid Arterial Duplex Study Indications:   TIA and Left sided numbness. Risk Factors:  None. Other Factors: Normal MRI brain. Performing Technologist: Jeb Levering RDMS, RVT  Examination Guidelines: A complete evaluation includes B-mode imaging, spectral Doppler, color Doppler, and power Doppler as needed of all accessible portions of each vessel. Bilateral testing is considered an integral part of a complete examination. Limited examinations for reoccurring indications may be performed as noted.  Right Carotid Findings: +----------+--------+--------+--------+--------+------------------+             PSV cm/s EDV cm/s Stenosis Describe Comments            +----------+--------+--------+--------+--------+------------------+  CCA Prox   107      24                                             +----------+--------+--------+--------+--------+------------------+  CCA Distal 94       24                                             +----------+--------+--------+--------+--------+------------------+  ICA Prox   45       16       Normal            intimal thickening  +----------+--------+--------+--------+--------+------------------+  ICA Distal 88       39                                             +----------+--------+--------+--------+--------+------------------+  ECA        127      10                                             +----------+--------+--------+--------+--------+------------------+ +----------+--------+-------+----------------+-------------------+             PSV cm/s EDV cms Describe         Arm Pressure (mmHG)  +----------+--------+-------+----------------+-------------------+  Subclavian 96               Multiphasic, WNL                      +----------+--------+-------+----------------+-------------------+ +---------+--------+--+--------+--+---------+  Vertebral PSV cm/s 64 EDV cm/s 22 Antegrade   +---------+--------+--+--------+--+---------+  Left Carotid Findings: +----------+--------+--------+--------+--------+------------------+             PSV cm/s EDV cm/s Stenosis Describe Comments            +----------+--------+--------+--------+--------+------------------+  CCA Prox   165      27                                             +----------+--------+--------+--------+--------+------------------+  CCA Distal 136      18                                             +----------+--------+--------+--------+--------+------------------+  ICA Prox   96       34       Normal            intimal thickening  +----------+--------+--------+--------+--------+------------------+  ICA Distal 84       33                                             +----------+--------+--------+--------+--------+------------------+  ECA        116      17                                             +----------+--------+--------+--------+--------+------------------+ +----------+--------+--------+----------------+-------------------+  Subclavian PSV cm/s EDV cm/s Describe         Arm Pressure (mmHG)  +----------+--------+--------+----------------+-------------------+             176               Multiphasic, WNL                      +----------+--------+--------+----------------+-------------------+ +---------+--------+--+--------+--+---------+  Vertebral PSV cm/s 39 EDV cm/s 18 Antegrade  +---------+--------+--+--------+--+---------+  Summary: Right Carotid: The extracranial vessels were near-normal with only minimal wall                thickening or plaque. Left Carotid: The extracranial vessels were near-normal with only minimal wall               thickening or plaque.  *See table(s) above for measurements and observations.     Preliminary      Subjective:   Patient was seen and examined 12/10/2018, 12:40 PM Patient stable today. No acute distress.  No issues overnight Stable for discharge.  Discharge Exam:    Vitals:    12/10/18 0503 12/10/18 0757 12/10/18 0915 12/10/18 1219  BP: 121/76 105/65 (!) 160/82 120/80  Pulse: (!) 53 (!) 55  63  Resp:  16  17  Temp:  (!) 97.5 F (36.4 C)  98.1 F (36.7 C)  TempSrc:  Oral  Oral  SpO2:  99%  100%  Weight:      Height:        General: Pt lying comfortably in bed & appears in no obvious distress. Cardiovascular: S1 & S2 heard, RRR, S1/S2 +. No murmurs, rubs, gallops or clicks. No JVD or pedal edema. Respiratory: Clear to auscultation without wheezing, rhonchi or crackles. No increased work of breathing. Abdominal:  Non-distended, non-tender & soft. No organomegaly or masses appreciated. Normal bowel sounds heard. CNS: Alert and oriented. No focal deficits. Extremities: no edema, no cyanosis    The results of significant diagnostics from this hospitalization (including imaging, microbiology, ancillary and laboratory) are listed below for reference.      Microbiology:   Recent Results (from the past 240 hour(s))  SARS Coronavirus 2 [facility] order, Performed in [facility] hospital lab) Nasopharyngeal Nasopharyngeal Swab     Status: None   Collection Time: 12/09/18  7:57 PM   Specimen: Nasopharyngeal Swab  Result Value Ref Range Status   SARS Coronavirus 2 NEGATIVE NEGATIVE Final    Comment: (NOTE) If result is NEGATIVE SARS-CoV-2 target nucleic acids are NOT DETECTED. The SARS-CoV-2 RNA is generally detectable in upper and lower  respiratory specimens during the acute phase of infection. The lowest  concentration of SARS-CoV-2 viral copies this assay can detect is 250  copies / mL. A negative result does not preclude SARS-CoV-2 infection  and should not be used as the sole basis for treatment or other  patient management decisions.  A negative result may occur with  improper specimen collection / handling, submission of specimen other  than nasopharyngeal swab, presence of viral mutation(s) within the  areas targeted by this assay, and inadequate  number of viral copies  (<250 copies / mL). A negative result must be combined with clinical  observations, patient history, and epidemiological information. If result is POSITIVE SARS-CoV-2 target nucleic acids are DETECTED. The SARS-CoV-2 RNA is generally detectable in upper and lower  respiratory specimens dur ing the acute phase of infection.  Positive  results are indicative of active infection with SARS-CoV-2.  Clinical  correlation with patient history and other diagnostic information is  necessary to determine patient infection status.  Positive results do  not rule out bacterial infection or co-infection with other viruses. If result is PRESUMPTIVE POSTIVE SARS-CoV-2 nucleic acids MAY BE PRESENT.   A presumptive positive result was obtained on the submitted specimen  and confirmed on repeat testing.  While 2019 novel coronavirus  (SARS-CoV-2) nucleic acids may  be present in the submitted sample  additional confirmatory testing may be necessary for epidemiological  and / or clinical management purposes  to differentiate between  SARS-CoV-2 and other Sarbecovirus currently known to infect humans.  If clinically indicated additional testing with an alternate test  methodology [phone]) is advised. The SARS-CoV-2 RNA is generally  detectable in upper and lower respiratory sp ecimens during the acute  phase of infection. The expected result is Negative. Fact Sheet for Patients:  BoilerBrush.com.cy Fact Sheet for Healthcare Providers: https://pope.com/ This test is not yet approved or cleared by the [place] FDA and has been authorized for detection and/or diagnosis of SARS-CoV-2 by FDA under an Emergency Use Authorization (EUA).  This EUA will remain in effect (meaning this test can be used) for the duration of the COVID-19 declaration under Section 564(b)(1) of the Act, 21 U.S.C. section 360bbb-3(b)(1), unless the  authorization is terminated or revoked sooner. Performed at [facility], [address] [place], [place] [address]      Labs:   CBC: Recent Labs  Lab 12/09/18 1802  WBC 6.3  NEUTROABS 3.8  HGB 15.4  HCT 49.1  MCV 88.6  PLT 204   Basic Metabolic Panel: Recent Labs  Lab 12/09/18 1802  NA 139  K 3.7  CL 105  CO2 24  GLUCOSE 101*  BUN 13  CREATININE 0.95  CALCIUM 9.4   Liver Function Tests: Recent Labs  Lab 12/09/18 1802  AST 25  ALT 26  ALKPHOS 63  BILITOT 1.5*  PROT 7.4  ALBUMIN 4.4    Hgb A1c Recent Labs    12/10/18 0349  HGBA1C 4.5*   Lipid Profile Recent Labs    12/10/18 0349  CHOL 206*  HDL 36*  LDLCALC 122*  TRIG 242*  CHOLHDL 5.7   Thyroid function studies Recent Labs    12/10/18 0349  TSH 1.679   Anemia work up Recent Labs    12/10/18 0349  VITAMINB12 360   Urinalysis    Component Value Date/Time   COLORURINE YELLOW 12/09/2018 1802   APPEARANCEUR CLEAR 12/09/2018 1802   LABSPEC 1.025 12/09/2018 1802   PHURINE 6.0 12/09/2018 1802   GLUCOSEU NEGATIVE 12/09/2018 1802   GLUCOSEU NEGATIVE 07/10/2018 1034   HGBUR NEGATIVE 12/09/2018 1802   BILIRUBINUR NEGATIVE 12/09/2018 1802   KETONESUR NEGATIVE 12/09/2018 1802   PROTEINUR NEGATIVE 12/09/2018 1802   UROBILINOGEN 0.2 07/10/2018 1034   NITRITE NEGATIVE 12/09/2018 1802   LEUKOCYTESUR NEGATIVE 12/09/2018 1802    Time coordinating discharge: Over 30 minutes  SIGNED: Kendell Bane, MD, FACP, FHM. [facility],  Pager [phone]  If 7PM-7AM, please contact night-coverage [email or web address] 12/10/2018, 12:40 PM

## 2018-12-10 NOTE — Progress Notes (Signed)
OT Cancellation Note  Patient Details Name: Jaquise Faux MRN: [id number] DOB: [birth year]   Cancelled Treatment:     Per PT pt is as baseline without difficulty completing BADL. No OT Needs identified, plans to be d/c shortly. Will sign off.  Zenovia Jarred, MSOT, OTR/L Behavioral Health OT/ Acute Relief OT [facility] Office: [phone]  Zenovia Jarred 12/10/2018, 2:10 PM

## 2018-12-10 NOTE — Progress Notes (Signed)
SLP Cancellation Note  Patient Details Name: Brandon [name] MRN: [id number] DOB: April 03, 1976   Cancelled treatment:       Reason Eval/Treat Not Completed: Patient at procedure or test/unavailable(Pt having echo at this time. SLP will follow up. )  Colletta Spillers I. Hardin Negus, [place], [place] Office number [phone] Pager [phone]  Horton Marshall 12/10/2018, 9:32 AM

## 2018-12-10 NOTE — Progress Notes (Signed)
Carotid duplex       has been completed. Preliminary results can be found under CV proc through chart review. Alazay Leicht, BS, RDMS, RVT   

## 2018-12-11 ENCOUNTER — Telehealth: Payer: Self-pay | Admitting: Behavioral Health

## 2018-12-11 LAB — FOLATE RBC
Folate, Hemolysate: 328 ng/mL
Folate, RBC: 707 ng/mL (ref >498)
Hematocrit: 46.4 % (ref 37.5–51.0)

## 2018-12-11 NOTE — Telephone Encounter (Signed)
Transition Care Management Follow-up Telephone Call   Date discharged? 12/10/2018   PCP: Libby Maw, MD  Disposition: Home   Recommendations for Outpatient Follow-up:    Follow up: in 2 week  Discharge Condition: Stable     How have you been since you were released from the hospital? Patient stated, "I'm doing ok".   Do you understand why you were in the hospital? yes   Do you understand the discharge instructions? yes   Where were you discharged to? Home   Items Reviewed:  Medications reviewed: yes  Allergies reviewed: NKA  Dietary changes reviewed: yes, patient reported that he's consuming a regular diet, but cutting down on sodium.  Referrals reviewed: yes, follow-up with PCP in 2 weeks.   Functional Questionnaire:   Activities of Daily Living (ADLs):   He states they are independent in the following: ambulation, bathing and hygiene, feeding, continence, grooming, toileting and dressing States they require assistance with the following: none    Any transportation issues/concerns?: no   Any patient concerns? no   Confirmed importance and date/time of follow-up visits scheduled yes, 12/17/2018 at 11:30 AM.  Provider Appointment booked with Dr. Ethelene Hal.   Confirmed with patient if condition begins to worsen call PCP or go to the ER.  Patient was given the office number and encouraged to call back with question or concerns.  : yes

## 2018-12-17 ENCOUNTER — Inpatient Hospital Stay: Payer: 59 | Admitting: Family Medicine

## 2018-12-24 ENCOUNTER — Encounter: Payer: Self-pay | Admitting: Family Medicine

## 2018-12-24 ENCOUNTER — Ambulatory Visit (INDEPENDENT_AMBULATORY_CARE_PROVIDER_SITE_OTHER): Payer: 59 | Admitting: Family Medicine

## 2018-12-24 VITALS — BP 122/70 | HR 89 | Ht 72.0 in | Wt 182.5 lb

## 2018-12-24 DIAGNOSIS — R0683 Snoring: Secondary | ICD-10-CM | POA: Diagnosis not present

## 2018-12-24 DIAGNOSIS — E7800 Pure hypercholesterolemia, unspecified: Secondary | ICD-10-CM

## 2018-12-24 DIAGNOSIS — F418 Other specified anxiety disorders: Secondary | ICD-10-CM

## 2018-12-24 DIAGNOSIS — G459 Transient cerebral ischemic attack, unspecified: Secondary | ICD-10-CM | POA: Diagnosis not present

## 2018-12-24 DIAGNOSIS — Z09 Encounter for follow-up examination after completed treatment for conditions other than malignant neoplasm: Secondary | ICD-10-CM

## 2018-12-24 MED ORDER — ATORVASTATIN CALCIUM 20 MG PO TABS: 20.0000 mg | ORAL_TABLET | Freq: Every day | ORAL | 3 refills | Status: AC

## 2018-12-24 MED ORDER — PAROXETINE HCL 10 MG PO TABS: ORAL_TABLET | ORAL | 0 refills | Status: DC

## 2018-12-24 NOTE — Progress Notes (Signed)
Established Patient Office Visit  Subjective:  Patient ID: Brandon [name], male    DOB: 04/28/1976  Age: 43 y.o. MRN: [id number]  CC:  Chief Complaint  Patient presents with  . Hospitalization Follow-up    HPI Ed Charo presents for hospitalization follow-up status post experiencing left-sided paresthesias that resolved spontaneously.  Blood pressure was normal on admission.  CT of his head, carotid artery studies and echocardiogram were all normal.  History of elevated ldl cholesterol.  He was started on Lipitor 40 mg daily.  He has started this medication and is having no issues from it.  His wife says that he snores loudly.  She did not mention apneic episodes.  He recently quit smoking and rarely drinks alcohol.  History of PSVT with as needed metoprolol.  He has not had any palpitations in quite some time and has not had to use his metoprolol.  He is an avid cyclist.  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  He and his wife are getting along better than they have in the past.  His father in law has moved into the house.  He experienced an episode of dry heaves last night after retiring.  He has not been having issues with reflux or dysphasia.  Past Medical History:  Diagnosis Date  . Chest pain 01/28/2018  . Palpitation   . Right knee pain     Past Surgical History:  Procedure Laterality Date  . PATELLA FRACTURE SURGERY      Family History  Problem Relation Age of Onset  . Cancer Mother        ESOPHAGEAL  . Diabetes Father   . Heart Problems Father        CARDIOMEGALY  . Healthy Brother     Social History   Socioeconomic History  . Marital status: Married    Spouse name: Not on file  . Number of children: 2  . Years of education: Not on file  . Highest education level: Not on file  Occupational History  . Not on file  Social Needs  . Financial resource strain: Not on file  . Food insecurity    Worry: Not on file    Inability: Not on file  . Transportation needs   Medical: Not on file    Non-medical: Not on file  Tobacco Use  . Smoking status: Current Every Day Smoker    Packs/day: 0.50    Types: Cigarettes  . Smokeless tobacco: Never Used  Substance and Sexual Activity  . Alcohol use: Yes    Comment: rare  . Drug use: Yes    Types: Marijuana  . Sexual activity: Not on file  Lifestyle  . Physical activity    Days per week: Not on file    Minutes per session: Not on file  . Stress: Not on file  Relationships  . Social Musicianconnections    Talks on phone: Not on file    Gets together: Not on file    Attends religious service: Not on file    Active member of club or organization: Not on file    Attends meetings of clubs or organizations: Not on file    Relationship status: Not on file  . Intimate partner violence    Fear of current or ex partner: Not on file    Emotionally abused: Not on file    Physically abused: Not on file    Forced sexual activity: Not on file  Other Topics Concern  . Not on  file  Social History Narrative  . Not on file    Outpatient Medications Prior to Visit  Medication Sig Dispense Refill  . aspirin 325 MG tablet Take 1 tablet (325 mg total) by mouth daily. 30 tablet 0  . mupirocin ointment (BACTROBAN) 2 % Place 1 application into the nose 2 (two) times daily. To the affected are and inside the nose 30 g 0  . nitroGLYCERIN (NITRODUR - DOSED IN MG/24 HR) 0.2 mg/hr patch APPLY 1/4TH PATCH TO AFFECTED KNEE, CHANGE DAILY 30 patch 1  . atorvastatin (LIPITOR) 40 MG tablet Take 1 tablet (40 mg total) by mouth daily at 6 PM. 30 tablet 1   No facility-administered medications prior to visit.     No Known Allergies  ROS Review of Systems  Constitutional: Negative for diaphoresis, fatigue, fever and unexpected weight change.  HENT: Negative.   Eyes: Negative for photophobia and visual disturbance.  Respiratory: Positive for apnea (t/-). Negative for chest tightness, shortness of breath and wheezing.   Cardiovascular:  Negative for chest pain and leg swelling.  Gastrointestinal: Positive for nausea. Negative for vomiting.  Endocrine: Negative for polyphagia and polyuria.  Genitourinary: Negative.   Musculoskeletal: Negative for gait problem, joint swelling and myalgias.  Skin: Negative for pallor and rash.  Allergic/Immunologic: Negative for immunocompromised state.  Neurological: Negative for numbness and headaches.  Hematological: Does not bruise/bleed easily.  Psychiatric/Behavioral: Positive for dysphoric mood. The patient is nervous/anxious.    Depression screen PHQ 2/9 12/24/2018  Decreased Interest 1  Down, Depressed, Hopeless 1  PHQ - 2 Score 2  Altered sleeping 2  Tired, decreased energy 1  Change in appetite 0  Feeling bad or failure about yourself  2  Trouble concentrating 0  Moving slowly or fidgety/restless 0  PHQ-9 Score 7      Objective:    Physical Exam  Constitutional: He is oriented to person, place, and time. He appears well-developed and well-nourished. No distress.  HENT:  Head: Normocephalic and atraumatic.  Right Ear: External ear normal.  Left Ear: External ear normal.  Mouth/Throat: No oropharyngeal exudate.    Eyes: Conjunctivae are normal. Right eye exhibits no discharge. Left eye exhibits no discharge. No scleral icterus.  Neck: No JVD present. No tracheal deviation present.  Cardiovascular: Normal rate, regular rhythm and normal heart sounds.  Pulmonary/Chest: Effort normal and breath sounds normal. No stridor.  Abdominal: Bowel sounds are normal.  Musculoskeletal:        General: No edema.  Neurological: He is alert and oriented to person, place, and time.  Skin: Skin is warm and dry. He is not diaphoretic.  Psychiatric: He has a normal mood and affect. His behavior is normal.    BP 122/70   Pulse 89   Ht 6' (1.829 m)   Wt 182 lb 8 oz (82.8 kg)   SpO2 97%   BMI 24.75 kg/m  Wt Readings from Last 3 Encounters:  12/24/18 182 lb 8 oz (82.8 kg)   12/09/18 180 lb (81.6 kg)  07/10/18 179 lb 6 oz (81.4 kg)   BP Readings from Last 3 Encounters:  12/24/18 122/70  12/10/18 120/80  07/10/18 122/70   Guideline developer:  UpToDate (see UpToDate for funding source) Date Released: June 2014  Health Maintenance Due  Topic Date Due  . Samul Dada  01/22/1993  . INFLUENZA VACCINE  12/07/2018    There are no preventive care reminders to display for this patient.  Lab Results  Component Value Date  TSH 1.679 12/10/2018   Lab Results  Component Value Date   WBC 6.3 12/09/2018   HGB 15.4 12/09/2018   HCT 46.4 12/10/2018   MCV 88.6 12/09/2018   PLT 204 12/09/2018   Lab Results  Component Value Date   NA 139 12/09/2018   K 3.7 12/09/2018   CO2 24 12/09/2018   GLUCOSE 101 (H) 12/09/2018   BUN 13 12/09/2018   CREATININE 0.95 12/09/2018   BILITOT 1.5 (H) 12/09/2018   ALKPHOS 63 12/09/2018   AST 25 12/09/2018   ALT 26 12/09/2018   PROT 7.4 12/09/2018   ALBUMIN 4.4 12/09/2018   CALCIUM 9.4 12/09/2018   ANIONGAP 10 12/09/2018   GFR 77.18 07/10/2018   Lab Results  Component Value Date   CHOL 206 (H) 12/10/2018   Lab Results  Component Value Date   HDL 36 (L) 12/10/2018   Lab Results  Component Value Date   LDLCALC 122 (H) 12/10/2018   Lab Results  Component Value Date   TRIG 242 (H) 12/10/2018   Lab Results  Component Value Date   CHOLHDL 5.7 12/10/2018   Lab Results  Component Value Date   HGBA1C 4.5 (L) 12/10/2018      Assessment & Plan:   Problem List Items Addressed This Visit      Cardiovascular and Mediastinum   TIA (transient ischemic attack)   Relevant Medications   atorvastatin (LIPITOR) 20 MG tablet     Other   Anxiety associated with depression   Relevant Medications   PARoxetine (PAXIL) 10 MG tablet   Snores   Relevant Orders   Ambulatory referral to Sleep Studies   Elevated LDL cholesterol level - Primary   Relevant Medications   atorvastatin (LIPITOR) 20 MG tablet   Hospital  discharge follow-up      Meds ordered this encounter  Medications  . atorvastatin (LIPITOR) 20 MG tablet    Sig: Take 1 tablet (20 mg total) by mouth daily.    Dispense:  90 tablet    Refill:  3  . PARoxetine (PAXIL) 10 MG tablet    Sig: Take one tablet daily for one week and then take 2 daily.    Dispense:  60 tablet    Refill:  0    Follow-up: Return in about 1 month (around 01/24/2019).   Believe that stress could have played a part in the development of patient's symptoms.  He agrees to give Paxil and try.  Because of apneas association with CVA will go ahead and pursue a sleep study.  Encouraged him to continue exercising per normal.  Have decreased his atorvastatin to 20 mg because I feel that will be adequate for lowering his LDL cholesterol to the near 70 range.

## 2018-12-24 NOTE — Patient Instructions (Signed)
Mindfulness-Based Stress Reduction Mindfulness-based stress reduction (MBSR) is a program that helps people learn to practice mindfulness. Mindfulness is the practice of intentionally paying attention to the present moment. It can be learned and practiced through techniques such as education, breathing exercises, meditation, and yoga. MBSR includes several mindfulness techniques in one program. MBSR works best when you understand the treatment, are willing to try new things, and can commit to spending time practicing what you learn. MBSR training may include learning about:  How your emotions, thoughts, and reactions affect your body.  New ways to respond to things that cause negative thoughts to start (triggers).  How to notice your thoughts and let go of them.  Practicing awareness of everyday things that you normally do without thinking.  The techniques and goals of different types of meditation. What are the benefits of MBSR? MBSR can have many benefits, which include helping you to:  Develop self-awareness. This refers to knowing and understanding yourself.  Learn skills and attitudes that help you to participate in your own health care.  Learn new ways to care for yourself.  Be more accepting about how things are, and let things go.  Be less judgmental and approach things with an open mind.  Be patient with yourself and trust yourself more. MBSR has also been shown to:  Reduce negative emotions, such as depression and anxiety.  Improve memory and focus.  Change how you sense and approach pain.  Boost your body's ability to fight infections.  Help you connect better with other people.  Improve your sense of well-being. Follow these instructions at home:   Find a local in-person or online MBSR program.  Set aside some time regularly for mindfulness practice.  Find a mindfulness practice that works best for you. This may include one or more of the following: ?  Meditation. Meditation involves focusing your mind on a certain thought or activity. ? Breathing awareness exercises. These help you to stay present by focusing on your breath. ? Body scan. For this practice, you lie down and pay attention to each part of your body from head to toe. You can identify tension and soreness and intentionally relax parts of your body. ? Yoga. Yoga involves stretching and breathing, and it can improve your ability to move and be flexible. It can also provide an experience of testing your body's limits, which can help you release stress. ? Mindful eating. This way of eating involves focusing on the taste, texture, color, and smell of each bite of food. Because this slows down eating and helps you feel full sooner, it can be an important part of a weight-loss plan.  Find a podcast or recording that provides guidance for breathing awareness, body scan, or meditation exercises. You can listen to these any time when you have a free moment to rest without distractions.  Follow your treatment plan as told by your health care provider. This may include taking regular medicines and making changes to your diet or lifestyle as recommended. How to practice mindfulness To do a basic awareness exercise:  Find a comfortable place to sit.  Pay attention to the present moment. Observe your thoughts, feelings, and surroundings just as they are.  Avoid placing judgment on yourself, your feelings, or your surroundings. Make note of any judgment that comes up, and let it go.  Your mind may wander, and that is okay. Make note of when your thoughts drift, and return your attention to the present moment. To do   basic mindfulness meditation:  Find a comfortable place to sit. This may include a stable chair or a firm floor cushion. ? Sit upright with your back straight. Let your arms fall next to your side with your hands resting on your legs. ? If sitting in a chair, rest your feet flat on  the floor. ? If sitting on a cushion, cross your legs in front of you.  Keep your head in a neutral position with your chin dropped slightly. Relax your jaw and rest the tip of your tongue on the roof of your mouth. Drop your gaze to the floor. You can close your eyes if you like.  Breathe normally and pay attention to your breath. Feel the air moving in and out of your nose. Feel your belly expanding and relaxing with each breath.  Your mind may wander, and that is okay. Make note of when your thoughts drift, and return your attention to your breath.  Avoid placing judgment on yourself, your feelings, or your surroundings. Make note of any judgment or feelings that come up, let them go, and bring your attention back to your breath.  When you are ready, lift your gaze or open your eyes. Pay attention to how your body feels after the meditation. Where to find more information You can find more information about MBSR from:  Your health care provider.  Community-based meditation centers or programs.  Programs offered near you. Summary  Mindfulness-based stress reduction (MBSR) is a program that teaches you how to intentionally pay attention to the present moment. It is used with other treatments to help you cope better with daily stress, emotions, and pain.  MBSR focuses on developing self-awareness, which allows you to respond to life stress without judgment or negative emotions.  MBSR programs may involve learning different mindfulness practices, such as breathing exercises, meditation, yoga, body scan, or mindful eating. Find a mindfulness practice that works best for you, and set aside time for it on a regular basis. This information is not intended to replace advice given to you by your health care provider. Make sure you discuss any questions you have with your health care provider. Document Released: 08/31/2016 Document Revised: 04/06/2017 Document Reviewed: 08/31/2016 Elsevier  Patient Education  2020 Elsevier Inc.  

## 2019-01-08 ENCOUNTER — Encounter: Payer: Self-pay | Admitting: Family Medicine

## 2019-01-08 ENCOUNTER — Ambulatory Visit (INDEPENDENT_AMBULATORY_CARE_PROVIDER_SITE_OTHER): Payer: 59 | Admitting: Family Medicine

## 2019-01-08 ENCOUNTER — Other Ambulatory Visit: Payer: Self-pay

## 2019-01-08 VITALS — BP 123/89 | Ht 72.0 in | Wt 182.0 lb

## 2019-01-08 DIAGNOSIS — Z8781 Personal history of (healed) traumatic fracture: Secondary | ICD-10-CM | POA: Diagnosis not present

## 2019-01-08 DIAGNOSIS — S82001S Unspecified fracture of right patella, sequela: Secondary | ICD-10-CM

## 2019-01-08 DIAGNOSIS — M25561 Pain in right knee: Secondary | ICD-10-CM | POA: Diagnosis not present

## 2019-01-08 NOTE — Assessment & Plan Note (Signed)
Having intermittent symptoms with skin tenting.  - referral to ortho for possible removal of hardware  - counseled on supportive care

## 2019-01-08 NOTE — Patient Instructions (Signed)
Nice to meet you You should get a call to schedule an appointment  Please send me a message in [place] with any questions or updates.  Please see [place] back as needed.   --Dr. Raeford Razor

## 2019-01-08 NOTE — Progress Notes (Signed)
Brandon [name] - 43 y.o. male MRN [id number]  Date of birth: 1976/05/04  SUBJECTIVE:  Including CC & ROS.  Chief Complaint  Patient presents with  . Knee Pain    right knee    Brandon [name] is a 43 y.o. male that is presenting with right knee pain.  He has a history of a patellar fracture which required surgery and fixation.  This occurred in 2018.  Since that time he has had intermittent pain over the anterior aspect of the knee and overlying patella.  He can be walking and experience significant pain that originates at the knee and radiates proximally or distally.  He is having tenting of the skin of where the hardware is for the kneecap.  Denies any recurrent injury.  Symptoms are intermittent in nature.  They can be mild to severe.  His surgery was initially in [facility].  Independent review of the right knee x-ray from 2019 shows intact and aligned surgical hardware.   Review of Systems  Constitutional: Negative for fever.  HENT: Negative for congestion.   Respiratory: Negative for cough.   Cardiovascular: Negative for chest pain.  Gastrointestinal: Negative for abdominal pain.  Musculoskeletal: Positive for joint swelling.  Skin: Negative for color change.  Neurological: Negative for weakness.  Hematological: Negative for adenopathy.    HISTORY: Past Medical, Surgical, Social, and Family History Reviewed & Updated per EMR.   Pertinent Historical Findings include:  Past Medical History:  Diagnosis Date  . Chest pain 01/28/2018  . Palpitation   . Right knee pain     Past Surgical History:  Procedure Laterality Date  . PATELLA FRACTURE SURGERY      No Known Allergies  Family History  Problem Relation Age of Onset  . Cancer Mother        ESOPHAGEAL  . Diabetes Father   . Heart Problems Father        CARDIOMEGALY  . Healthy Brother      Social History   Socioeconomic History  . Marital status: Married    Spouse name: Not on file  . Number of children: 2  .  Years of education: Not on file  . Highest education level: Not on file  Occupational History  . Not on file  Social Needs  . Financial resource strain: Not on file  . Food insecurity    Worry: Not on file    Inability: Not on file  . Transportation needs    Medical: Not on file    Non-medical: Not on file  Tobacco Use  . Smoking status: Current Every Day Smoker    Packs/day: 0.50    Types: Cigarettes  . Smokeless tobacco: Never Used  Substance and Sexual Activity  . Alcohol use: Yes    Comment: rare  . Drug use: Yes    Types: Marijuana  . Sexual activity: Not on file  Lifestyle  . Physical activity    Days per week: Not on file    Minutes per session: Not on file  . Stress: Not on file  Relationships  . Social Herbalist on phone: Not on file    Gets together: Not on file    Attends religious service: Not on file    Active member of club or organization: Not on file    Attends meetings of clubs or organizations: Not on file    Relationship status: Not on file  . Intimate partner violence    Fear of current  or ex partner: Not on file    Emotionally abused: Not on file    Physically abused: Not on file    Forced sexual activity: Not on file  Other Topics Concern  . Not on file  Social History Narrative  . Not on file     PHYSICAL EXAM:  VS: BP 123/89   Ht 6' (1.829 m)   Wt 182 lb (82.6 kg)   BMI 24.68 kg/m  Physical Exam Gen: NAD, alert, cooperative with exam, well-appearing ENT: normal lips, normal nasal mucosa,  Eye: normal EOM, normal conjunctiva and lids CV:  no edema, +2 pedal pulses   Resp: no accessory muscle use, non-labored,  Skin: no rashes, no areas of induration  Neuro: normal tone, normal sensation to touch Psych:  normal insight, alert and oriented MSK:  Right knee:  Well healed vertical incision  Normal ROM  Normal strength to resistance  Tenting of the skin overlying the patella.  NVI      ASSESSMENT & PLAN:   Right  patella fracture Having intermittent symptoms with skin tenting.  - referral to ortho for possible removal of hardware  - counseled on supportive care

## 2019-01-15 ENCOUNTER — Ambulatory Visit (INDEPENDENT_AMBULATORY_CARE_PROVIDER_SITE_OTHER): Payer: 59 | Admitting: Neurology

## 2019-01-15 ENCOUNTER — Other Ambulatory Visit: Payer: Self-pay

## 2019-01-15 ENCOUNTER — Encounter: Payer: Self-pay | Admitting: Neurology

## 2019-01-15 VITALS — BP 122/75 | HR 71 | Ht 72.0 in | Wt 183.0 lb

## 2019-01-15 DIAGNOSIS — R519 Headache, unspecified: Secondary | ICD-10-CM

## 2019-01-15 DIAGNOSIS — Z8673 Personal history of transient ischemic attack (TIA), and cerebral infarction without residual deficits: Secondary | ICD-10-CM

## 2019-01-15 DIAGNOSIS — R51 Headache: Secondary | ICD-10-CM | POA: Diagnosis not present

## 2019-01-15 DIAGNOSIS — R0683 Snoring: Secondary | ICD-10-CM | POA: Diagnosis not present

## 2019-01-15 DIAGNOSIS — F17200 Nicotine dependence, unspecified, uncomplicated: Secondary | ICD-10-CM | POA: Diagnosis not present

## 2019-01-15 NOTE — Patient Instructions (Signed)

## 2019-01-15 NOTE — Progress Notes (Signed)
Subjective:    Patient ID: Sarina IllShawn Rossetti is a 43 y.o. male.  HPI     Huston FoleySaima Letonia Stead, MD, PhD [facility] [address], [address] [address] [place], [place] [address]  Dear Dr. Doreene BurkeKremer,   I saw your patient, Sarina IllShawn Berroa, upon your kind request in my sleep clinic today for initial consultation of his sleep disorder, in particular, concern for underlying obstructive sleep apnea.  The patient is unaccompanied today.  As you know, Mr. Joaquim Laiinling is a 43 year old right-handed gentleman with an underlying medical history of hyperlipidemia, smoking, PSVT, and recent hospitalization in August 2020 for paresthesias on the left side and suspected TIA (with work-up in the hospital negative for any acute stroke), who reports snoring and excessive daytime somnolence and recurrent morning headaches.  I reviewed your office note from 12/24/2018.  His Epworth sleepiness score is 8 out of 24, fatigue severity score is 24 out of 63.  He is married and lives with his wife, 25 yo son and father-in-law.  He has 2 children, Older son is 4515 and [place] in [place] mother.  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 He smokes a little over half a pack per day, Is working on smoking cessation, he drinks alcohol rarely, he drinks caffeine in the form of coffee, 1 cup/day and soda about 1/day. He is on Lipitor and adult size aspirin. 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  His Past Medical History Is Significant For: Past Medical History:  Diagnosis Date  . Chest pain 01/28/2018  . Palpitation   . Right knee pain     His Past Surgical History Is Significant For: Past Surgical History:  Procedure Laterality Date  . PATELLA FRACTURE  SURGERY      His Family History Is Significant For: Family History  Problem Relation Age of Onset  . Cancer Mother        ESOPHAGEAL  . Diabetes Father   . Heart Problems Father        CARDIOMEGALY  . Healthy Brother     His Social History Is Significant For: Social History   Socioeconomic History  . Marital status: Married    Spouse name: Not on file  . Number of children: 2  . Years of education: Not on file  . Highest education level: Not on file  Occupational History  . Not on file  Social Needs  . Financial resource strain: Not on file  . Food insecurity    Worry: Not on file    Inability: Not on file  . Transportation needs    Medical: Not on file    Non-medical: Not on file  Tobacco Use  . Smoking status: Current Every Day Smoker    Packs/day: 0.50    Types: Cigarettes  . Smokeless tobacco: Never Used  Substance and Sexual Activity  . Alcohol use: Yes    Comment: rare  . Drug use: Yes    Types: Marijuana  . Sexual activity: Not on file  Lifestyle  . Physical activity    Days per week: Not on file    Minutes per session: Not on file  . Stress: Not on file  Relationships  . Social connections  Talks on phone: Not on file    Gets together: Not on file    Attends religious service: Not on file    Active member of club or organization: Not on file    Attends meetings of clubs or organizations: Not on file    Relationship status: Not on file  Other Topics Concern  . Not on file  Social History Narrative  . Not on file    His Allergies Are:  No Known Allergies:   His Current Medications Are:  Outpatient Encounter Medications as of 01/15/2019  Medication Sig  . aspirin 325 MG tablet Take 1 tablet (325 mg total) by mouth daily.  Marland Kitchen. atorvastatin (LIPITOR) 20 MG tablet Take 1 tablet (20 mg total) by mouth daily.  Marland Kitchen. PARoxetine (PAXIL) 10 MG tablet Take one tablet daily for one week and then take 2 daily.  . [DISCONTINUED] mupirocin ointment  (BACTROBAN) 2 % Place 1 application into the nose 2 (two) times daily. To the affected are and inside the nose  . [DISCONTINUED] nitroGLYCERIN (NITRODUR - DOSED IN MG/24 HR) 0.2 mg/hr patch APPLY 1/4TH PATCH TO AFFECTED KNEE, CHANGE DAILY   No facility-administered encounter medications on file as of 01/15/2019.   :  Review of Systems:  Out of a complete 14 point review of systems, all are reviewed and negative with the exception of these symptoms as listed below: Review of Systems  Neurological:       Pt presents today to discuss his sleep. Pt has never had a sleep study but does endorse snoring.  Epworth Sleepiness Scale 0= would never doze 1= slight chance of dozing 2= moderate chance of dozing 3= high chance of dozing  Sitting and reading: 1 Watching TV: 1 Sitting inactive in a public place (ex. Theater or meeting): 2 As a passenger in a car for an hour without a break: 1 Lying down to rest in the afternoon: 2 Sitting and talking to someone: 0 Sitting quietly after lunch (no alcohol): 1 In a car, while stopped in traffic: 0 Total: 8     Objective:  Neurological Exam  Physical Exam Physical Examination:   Vitals:   01/15/19 1339  BP: 122/75  Pulse: 71    General Examination: The patient is a very pleasant 43 y.o. male in no acute distress. He appears well-developed and well-nourished and well groomed.   HEENT: Normocephalic, atraumatic, pupils are equal, round and reactive to light and accommodation. Funduscopic exam is normal with sharp disc margins noted. Extraocular tracking is good without limitation to gaze excursion or nystagmus noted. Normal smooth pursuit is noted. Hearing is grossly intact. Tympanic membranes are clear bilaterally. Face is symmetric with normal facial animation and normal facial sensation. Speech is clear with no dysarthria noted. There is no hypophonia. There is no lip, neck/head, jaw or voice tremor. Neck is supple with full range of passive  and active motion. There are no carotid bruits on auscultation. Oropharynx exam reveals: mild mouth dryness, adequate dental hygiene and mild airway crowding, due to Slightly larger uvula, tonsils of 1+ bilaterally, Mallampati class II, normal tongue, which protrudes centrally in palate elevates symmetrically.   Chest: Clear to auscultation without wheezing, rhonchi or crackles noted.  Heart: S1+S2+0, regular and normal without murmurs, rubs or gallops noted.   Abdomen: Soft, non-tender and non-distended with normal bowel sounds appreciated on auscultation.  Extremities: There is no pitting edema in the distal lower extremities bilaterally. Pedal pulses are intact.  Skin: Warm and dry  without trophic changes noted.  Musculoskeletal: exam reveals no obvious joint deformities, tenderness or joint swelling or erythema.   Neurologically:  Mental status: The patient is awake, alert and oriented in all 4 spheres. His immediate and remote memory, attention, language skills and fund of knowledge are appropriate. There is no evidence of aphasia, agnosia, apraxia or anomia. Speech is clear with normal prosody and enunciation. Thought process is linear. Mood is normal and affect is normal.  Cranial nerves II - XII are as described above under HEENT exam. In addition: shoulder shrug is normal with equal shoulder height noted. Motor exam: Normal bulk, strength and tone is noted. There is no drift, or tremor. Romberg is negative. Reflexes are 1+. Fine motor skills and coordination: intact with normal finger taps, normal hand movements, normal rapid alternating patting, normal foot taps and normal foot agility.  Cerebellar testing: No dysmetria or intention tremor on finger to nose testing. Heel to shin is unremarkable bilaterally. There is no truncal or gait ataxia.  Sensory exam: intact to light touch in the upper and lower extremities.  Gait, station and balance: He stands easily. No veering to one side is  noted. No leaning to one side is noted. Posture is age-appropriate and stance is narrow based. Gait shows normal stride length and normal pace. No problems turning are noted. Tandem walk is slightly challenging for him.                Assessment and Plan:   In summary, Abdinasir Spadafore is a very pleasant 43 y.o.-year old male with an underlying medical history of hyperlipidemia, smoking, PSVT, and recent hospitalization in August 2020 for paresthesias on the left side and suspected TIA (with work-up in the hospital negative for any acute stroke, no residual Sx), whose history and physical exam are concerning for obstructive sleep apnea (OSA). I had a long chat with the patient about my findings and the diagnosis of OSA, its prognosis and treatment options. We talked about medical treatments, surgical interventions and non-pharmacological approaches. I explained in particular the risks and ramifications of untreated moderate to severe OSA, especially with respect to developing cardiovascular disease down the Road, including congestive heart failure, difficult to treat hypertension, cardiac arrhythmias, or stroke. Even type 2 diabetes has, in part, been linked to untreated OSA. Symptoms of untreated OSA include daytime sleepiness, memory problems, mood irritability and mood disorder such as depression and anxiety, lack of energy, as well as recurrent headaches, especially morning headaches. We talked about smoking cessation and trying to maintain a healthy lifestyle in general, as well as the importance of weight control. I encouraged the patient to eat healthy, exercise daily and keep well hydrated, to keep a scheduled bedtime and wake time routine, to not skip any meals and eat healthy snacks in between meals. I advised the patient not to drive when feeling sleepy. I recommended the following at this time: sleep study.   I explained the sleep test procedure to the patient and also outlined possible surgical and  non-surgical treatment options of OSA, including the use of a custom-made dental device (which would require a referral to a specialist dentist or oral surgeon), upper airway surgical options, such as pillar implants, radiofrequency surgery, tongue base surgery, and UPPP (which would involve a referral to an ENT surgeon). Rarely, jaw surgery such as mandibular advancement may be considered.  I also explained the CPAP treatment option to the patient, who indicated that he would be willing to try CPAP  if the need arises. I explained the importance of being compliant with PAP treatment, not only for insurance purposes but primarily to improve His symptoms, and for the patient's long term health benefit, including to reduce His cardiovascular risks. I answered all his questions today and the patient was in agreement. I plan to see him back after the sleep study is completed and encouraged him to call with any interim questions, concerns, problems or updates.   Thank you very much for allowing me to participate in the care of this nice patient. If I can be of any further assistance to you please do not hesitate to call me at [phone].  Sincerely,   Huston Foley, MD, PhD

## 2019-01-20 ENCOUNTER — Encounter: Payer: Self-pay | Admitting: Family Medicine

## 2019-01-20 ENCOUNTER — Other Ambulatory Visit: Payer: Self-pay

## 2019-01-20 ENCOUNTER — Ambulatory Visit (INDEPENDENT_AMBULATORY_CARE_PROVIDER_SITE_OTHER): Payer: 59 | Admitting: Family Medicine

## 2019-01-20 VITALS — BP 110/70 | HR 72 | Ht 72.0 in | Wt 184.0 lb

## 2019-01-20 DIAGNOSIS — G459 Transient cerebral ischemic attack, unspecified: Secondary | ICD-10-CM | POA: Diagnosis not present

## 2019-01-20 DIAGNOSIS — Z720 Tobacco use: Secondary | ICD-10-CM

## 2019-01-20 DIAGNOSIS — E7800 Pure hypercholesterolemia, unspecified: Secondary | ICD-10-CM

## 2019-01-20 DIAGNOSIS — Z23 Encounter for immunization: Secondary | ICD-10-CM | POA: Diagnosis not present

## 2019-01-20 DIAGNOSIS — R0683 Snoring: Secondary | ICD-10-CM

## 2019-01-20 DIAGNOSIS — F418 Other specified anxiety disorders: Secondary | ICD-10-CM | POA: Diagnosis not present

## 2019-01-20 DIAGNOSIS — R21 Rash and other nonspecific skin eruption: Secondary | ICD-10-CM

## 2019-01-20 MED ORDER — TRIAMCINOLONE ACETONIDE 0.025 % EX OINT: 1.0000 "application " | TOPICAL_OINTMENT | Freq: Two times a day (BID) | CUTANEOUS | 1 refills | Status: DC

## 2019-01-20 MED ORDER — PAROXETINE HCL 20 MG PO TABS: 20.0000 mg | ORAL_TABLET | Freq: Every day | ORAL | 0 refills | Status: DC

## 2019-01-20 NOTE — Progress Notes (Signed)
Established Patient Office Visit  Subjective:  Patient ID: Brandon [name], male    DOB: 1975-07-22  Age: 43 y.o. MRN: [id number]  CC:  Chief Complaint  Patient presents with  . Follow-up    HPI Brandon [name] presents for follow-up of his anxiety with some depression status post starting Paxil.  Cannot really tell 1 way or the other about whether or not his mood has been elevated but his wife says that he is definitely sleeping better.  Perhaps a little less anxious.  Denies any suicidal thoughts or ideas.  Tolerating the Lipitor well without myalgias.  He is yet to start riding his bike again because of the knee issue.  Planning on orthopedic care for that.  Interested in discussing smoking cessation.  He has a rash on his lower back that is mildly pruritic.  Past Medical History:  Diagnosis Date  . Chest pain 01/28/2018  . Palpitation   . Right knee pain     Past Surgical History:  Procedure Laterality Date  . PATELLA FRACTURE SURGERY      Family History  Problem Relation Age of Onset  . Cancer Mother        ESOPHAGEAL  . Diabetes Father   . Heart Problems Father        CARDIOMEGALY  . Healthy Brother     Social History   Socioeconomic History  . Marital status: Married    Spouse name: Not on file  . Number of children: 2  . Years of education: Not on file  . Highest education level: Not on file  Occupational History  . Not on file  Social Needs  . Financial resource strain: Not on file  . Food insecurity    Worry: Not on file    Inability: Not on file  . Transportation needs    Medical: Not on file    Non-medical: Not on file  Tobacco Use  . Smoking status: Current Every Day Smoker    Packs/day: 0.50    Types: Cigarettes  . Smokeless tobacco: Never Used  Substance and Sexual Activity  . Alcohol use: Yes    Comment: rare  . Drug use: Yes    Types: Marijuana  . Sexual activity: Not on file  Lifestyle  . Physical activity    Days per week: Not on file     Minutes per session: Not on file  . Stress: Not on file  Relationships  . Social Musician on phone: Not on file    Gets together: Not on file    Attends religious service: Not on file    Active member of club or organization: Not on file    Attends meetings of clubs or organizations: Not on file    Relationship status: Not on file  . Intimate partner violence    Fear of current or ex partner: Not on file    Emotionally abused: Not on file    Physically abused: Not on file    Forced sexual activity: Not on file  Other Topics Concern  . Not on file  Social History Narrative  . Not on file    Outpatient Medications Prior to Visit  Medication Sig Dispense Refill  . aspirin 325 MG tablet Take 1 tablet (325 mg total) by mouth daily. 30 tablet 0  . atorvastatin (LIPITOR) 20 MG tablet Take 1 tablet (20 mg total) by mouth daily. 90 tablet 3  . PARoxetine (PAXIL) 10 MG tablet Take  one tablet daily for one week and then take 2 daily. 60 tablet 0   No facility-administered medications prior to visit.     No Known Allergies  ROS Review of Systems  Constitutional: Negative.   HENT: Negative.   Eyes: Negative for photophobia and visual disturbance.  Respiratory: Negative.   Cardiovascular: Negative.   Gastrointestinal: Negative.   Endocrine: Negative for polyphagia and polyuria.  Genitourinary: Negative.   Musculoskeletal: Negative for joint swelling and myalgias.  Skin: Negative for pallor and rash.  Allergic/Immunologic: Negative for immunocompromised state.  Neurological: Negative for light-headedness and headaches.  Hematological: Does not bruise/bleed easily.  Psychiatric/Behavioral: Negative.       Objective:    Physical Exam  Constitutional: He is oriented to person, place, and time. He appears well-developed and well-nourished. No distress.  HENT:  Head: Normocephalic and atraumatic.  Right Ear: External ear normal.  Left Ear: External ear normal.   Eyes: Right eye exhibits no discharge. Left eye exhibits no discharge. No scleral icterus.  Neck: No JVD present. No tracheal deviation present.  Pulmonary/Chest: Effort normal. No stridor.  Neurological: He is alert and oriented to person, place, and time.  Skin: Skin is warm and dry. He is not diaphoretic.  Psychiatric: He has a normal mood and affect. His behavior is normal.    BP 110/70   Pulse 72   Ht 6' (1.829 m)   Wt 184 lb (83.5 kg)   SpO2 97%   BMI 24.95 kg/m  Wt Readings from Last 3 Encounters:  01/20/19 184 lb (83.5 kg)  01/15/19 183 lb (83 kg)  01/08/19 182 lb (82.6 kg)   BP Readings from Last 3 Encounters:  01/20/19 110/70  01/15/19 122/75  01/08/19 123/89   Guideline developer:  UpToDate (see UpToDate for funding source) Date Released: June 2014  Health Maintenance Due  Topic Date Due  . Samul Dada  01/22/1993    There are no preventive care reminders to display for this patient.  Lab Results  Component Value Date   TSH 1.679 12/10/2018   Lab Results  Component Value Date   WBC 6.3 12/09/2018   HGB 15.4 12/09/2018   HCT 46.4 12/10/2018   MCV 88.6 12/09/2018   PLT 204 12/09/2018   Lab Results  Component Value Date   NA 139 12/09/2018   K 3.7 12/09/2018   CO2 24 12/09/2018   GLUCOSE 101 (H) 12/09/2018   BUN 13 12/09/2018   CREATININE 0.95 12/09/2018   BILITOT 1.5 (H) 12/09/2018   ALKPHOS 63 12/09/2018   AST 25 12/09/2018   ALT 26 12/09/2018   PROT 7.4 12/09/2018   ALBUMIN 4.4 12/09/2018   CALCIUM 9.4 12/09/2018   ANIONGAP 10 12/09/2018   GFR 77.18 07/10/2018   Lab Results  Component Value Date   CHOL 206 (H) 12/10/2018   Lab Results  Component Value Date   HDL 36 (L) 12/10/2018   Lab Results  Component Value Date   LDLCALC 122 (H) 12/10/2018   Lab Results  Component Value Date   TRIG 242 (H) 12/10/2018   Lab Results  Component Value Date   CHOLHDL 5.7 12/10/2018   Lab Results  Component Value Date   HGBA1C 4.5 (L)  12/10/2018      Assessment & Plan:   Problem List Items Addressed This Visit      Cardiovascular and Mediastinum   TIA (transient ischemic attack)     Musculoskeletal and Integument   Rash and nonspecific skin eruption  Relevant Medications   triamcinolone (KENALOG) 0.025 % ointment     Other   Anxiety associated with depression   Relevant Medications   PARoxetine (PAXIL) 20 MG tablet   Snores   Elevated LDL cholesterol level - Primary   Tobacco use    Other Visit Diagnoses    Need for influenza vaccination       Relevant Orders   Flu Vaccine QUAD 36+ mos IM (Completed)      Meds ordered this encounter  Medications  . triamcinolone (KENALOG) 0.025 % ointment    Sig: Apply 1 application topically 2 (two) times daily.    Dispense:  80 g    Refill:  1  . PARoxetine (PAXIL) 20 MG tablet    Sig: Take 1 tablet (20 mg total) by mouth daily.    Dispense:  90 tablet    Refill:  0    Follow-up: Return in about 2 months (around 03/22/2019).    Spent 20 minutes discussing smoking cessation strategies to include over-the-counter patches, gums or lozenges.  Also discussed quitting cold [place]key which she has been able to do before.  She had asked was also discussed that side effects were discussed.  And how to handle those.  We also discussed possibly augmenting with Wellbutrin as a smoking cessation tactic at his follow-up visit in 2 months.  We will continue the Paxil for a couple more months and he knows that it may take another 4 to 6 weeks to see maximum benefit.  He will see see me sooner if need be.

## 2019-01-20 NOTE — Patient Instructions (Signed)

## 2019-01-29 ENCOUNTER — Telehealth: Payer: Self-pay

## 2019-01-29 NOTE — Telephone Encounter (Signed)
   [facility] Pre-operative Risk Assessment    Request for surgical clearance:  1. What type of surgery is being performed? Right knee removal of painful retained hardware  2. When is this surgery scheduled? TBD  3. What type of clearance is required (medical clearance vs. Pharmacy clearance to hold med vs. Both)? Both  4. Are there any medications that need to be held prior to surgery and how long? Aspirin  5. Practice name and name of physician performing surgery? [facility]  6. What is your office phone number [phone]   7.   What is your office fax number [phone]  8.   Anesthesia type  General   Kathyrn Lass 01/29/2019, 1:26 PM  _________________________________________________________________   (provider comments below)

## 2019-01-30 NOTE — Telephone Encounter (Signed)
   Primary Cardiologist: Kirk Ruths, MD  Chart reviewed as part of pre-operative protocol coverage. Patient was contacted 01/30/2019 in reference to pre-operative risk assessment for pending surgery as outlined below.  Brandon [name] was last seen on 06/19/18 by Kerin Ransom PAC.  Since that day, Brandon [name] has done well.  He does not have a history of MI or CVA. He can complete more than 4.0 METS without anginal symptoms. He has had no further issues of PSVT.   Therefore, based on ACC/AHA guidelines, the patient would be at acceptable risk for the planned procedure without further cardiovascular testing.   I will route this recommendation to the requesting party via Epic fax function and remove from pre-op pool.  Please call with questions.  Ledora Bottcher, PA 01/30/2019, 3:44 PM

## 2019-02-26 ENCOUNTER — Ambulatory Visit (INDEPENDENT_AMBULATORY_CARE_PROVIDER_SITE_OTHER): Payer: 59 | Admitting: Neurology

## 2019-02-26 DIAGNOSIS — Z8673 Personal history of transient ischemic attack (TIA), and cerebral infarction without residual deficits: Secondary | ICD-10-CM

## 2019-02-26 DIAGNOSIS — R0683 Snoring: Secondary | ICD-10-CM

## 2019-02-26 DIAGNOSIS — R519 Headache, unspecified: Secondary | ICD-10-CM

## 2019-02-26 DIAGNOSIS — F17200 Nicotine dependence, unspecified, uncomplicated: Secondary | ICD-10-CM

## 2019-02-26 DIAGNOSIS — G4733 Obstructive sleep apnea (adult) (pediatric): Secondary | ICD-10-CM

## 2019-02-28 NOTE — Progress Notes (Signed)
Patient referred by Dr. Ethelene Hal, seen by me on 01/15/19, HST on 02/26/19.   Please call and notify the patient that the recent home sleep test did not show any significant obstructive sleep apnea. Some snoring was noted. He can follow up with his referring provider. I can see patient in FU prn. He had TIA w/u in the hospital in August.  Thanks,  Star Age, MD, PhD [facility])

## 2019-02-28 NOTE — Procedures (Signed)
Patient Information     First Name: Brandon Last Name: [name] ID: [id number]  Birth Date: [birth year] Age: 43 Gender: Male  Referring Provider: Dr. Doreene Burke BMI: 24.8 (W=183 lb, H=6' 0'')    Epworth:  8/24   Sleep Study Information    Study Date: Feb 26, 2019 S/H/A Version: [id number] / 4.1.1528 / 62  History:    43 year old man with a history of hyperlipidemia, smoking, PSVT, and recent hospitalization in August 2020 for paresthesias on the left side and suspected TIA, who reports snoring, daytime somnolence and recurrent morning headaches.     Summary & Diagnosis:    Primary snoring Recommendations:     This home sleep test does not demonstrate any significant obstructive or central sleep disordered breathing. Some snoring was noted, ranging in the mild to moderate range. Other causes of the patient's symptoms, including circadian rhythm disturbances, an underlying mood disorder, medication effect and/or an underlying medical problem cannot be ruled out based on this test. Clinical correlation is recommended. The patient should be cautioned not to drive, work at heights, or operate dangerous or heavy equipment when tired or sleepy. Review and reiteration of good sleep hygiene measures should be pursued with any patient. The patient can follow up with his referring provider, who will be notified of the test results. An appointment in sleep clinic can be made as necessary.   I certify that I have reviewed the raw data recording prior to the issuance of this report in accordance with the standards of the American Academy of Sleep Medicine (AASM).  Huston Foley, MD, PhD Diplomat, ABPN (Neurology and Sleep)                 Sleep Summary    Oxygen Saturation Statistics     Start Study Time: End Study Time: Total Recording Time:     10:35:06 PM      7:39:51 AM      9 h, 4 min  Total Sleep Time % REM of Sleep Time:  5 h, 46 min  10.3    Mean: 95 Minimum: 93 Maximum: 98   Mean of Desaturations Nadirs (%):   94  Oxygen Desaturation. %: 4-9 10-20 >20 Total  Events Number Total  1 100.0  0 0.0  0 0.0  1 100.0  Oxygen Saturation: <90 <=88 <85 <80 <70  Duration (minutes): Sleep % 0.0 0.0 0.0 0.0 0.0 0.0 0.0 0.0 0.0 0.0     Respiratory Indices      Total Events REM NREM All Night  pRDI:  22  pAHI:  10 ODI:  1  pAHIc:1  % CSR: 0.0 11.8 5.1 0.0 0.0 5.0 2.3 0.3 0.3 6.1 2.8 0.3 0.3       Pulse Rate Statistics during Sleep (BPM)      Mean: 68 Minimum: 53  Maximum: 92    Indices are calculated using technically valid sleep time of  3 hrs, 35 min. pRDI/pAHI are calculated using oxi desaturations ? 3%  Body Position Statistics  Position Supine Prone Right Left Non-Supine  Sleep (min) 194.3 41.0 110.0 1.0 152.0  Sleep % 56.1 11.8 31.8 0.3 43.9  pRDI 5.9 15.8 4.3 N/A 6.4  pAHI 3.8 3.2 1.4 N/A 1.7  ODI 0.5 0.0 0.0 N/A 0.0     Snoring Statistics Snoring Level (dB) >40 >50 >60 >70 >80 >Threshold (45)  Sleep (min) 70.6 12.3 2.7 0.0 0.0 26.3  Sleep % 20.4 3.5 0.8 0.0 0.0 7.6  Mean: 41 dB Sleep Stages Chart

## 2019-03-04 ENCOUNTER — Telehealth: Payer: Self-pay

## 2019-03-04 NOTE — Telephone Encounter (Signed)
-----   Message from Star Age, MD sent at 02/28/2019  2:05 PM EDT ----- Patient referred by Dr. Ethelene Hal, seen by me on 01/15/19, HST on 02/26/19.   Please call and notify the patient that the recent home sleep test did not show any significant obstructive sleep apnea. Some snoring was noted. He can follow up with his referring provider. I can see patient in FU prn. He had TIA w/u in the hospital in August.  Thanks,  Star Age, MD, PhD [facility])

## 2019-03-04 NOTE — Telephone Encounter (Signed)
I called pt to discuss his sleep study results. No answer, left a message asking him to call me back. 

## 2019-03-10 NOTE — Telephone Encounter (Signed)
I called pt and discussed his sleep study results and recommendations. Pt will follow up with Dr. Ethelene Hal. Pt verbalized understanding of results. Pt had no questions at this time but was encouraged to call back if questions arise.

## 2019-03-20 ENCOUNTER — Other Ambulatory Visit: Payer: Self-pay

## 2019-03-21 ENCOUNTER — Encounter: Payer: Self-pay | Admitting: Family Medicine

## 2019-03-21 ENCOUNTER — Ambulatory Visit (INDEPENDENT_AMBULATORY_CARE_PROVIDER_SITE_OTHER): Payer: 59 | Admitting: Family Medicine

## 2019-03-21 VITALS — BP 124/80 | HR 88 | Ht 72.0 in | Wt 188.0 lb

## 2019-03-21 DIAGNOSIS — F418 Other specified anxiety disorders: Secondary | ICD-10-CM | POA: Diagnosis not present

## 2019-03-21 DIAGNOSIS — T887XXA Unspecified adverse effect of drug or medicament, initial encounter: Secondary | ICD-10-CM | POA: Diagnosis not present

## 2019-03-21 DIAGNOSIS — R194 Change in bowel habit: Secondary | ICD-10-CM | POA: Diagnosis not present

## 2019-03-21 DIAGNOSIS — E7800 Pure hypercholesterolemia, unspecified: Secondary | ICD-10-CM

## 2019-03-21 MED ORDER — PAROXETINE HCL 10 MG PO TABS: ORAL_TABLET | ORAL | 0 refills | Status: DC

## 2019-03-21 MED ORDER — BUPROPION HCL ER (XL) 150 MG PO TB24: ORAL_TABLET | ORAL | 1 refills | Status: DC

## 2019-03-21 NOTE — Progress Notes (Addendum)
Established Patient Office Visit  Subjective:  Patient ID: Brandon [name], male    DOB: 09-16-1975  Age: 43 y.o. MRN: [id number]  CC:  Chief Complaint  Patient presents with  . Follow-up    HPI Brandon [name] presents for follow-up of his depression.  Cannot say that the Paxil is helping but it does not seem to be hurting either.  He wonders if it has led to some ED for him.  Some correlation of onset of therapy and this issue for him.  Advised that this drug or likely to lead to delayed orgasm.  Denies issues with that.  Continues to smoke.  Hardware scheduled to be removed from right knee this coming week.  Past Medical History:  Diagnosis Date  . Chest pain 01/28/2018  . Palpitation   . Right knee pain     Past Surgical History:  Procedure Laterality Date  . PATELLA FRACTURE SURGERY      Family History  Problem Relation Age of Onset  . Cancer Mother        ESOPHAGEAL  . Diabetes Father   . Heart Problems Father        CARDIOMEGALY  . Healthy Brother     Social History   Socioeconomic History  . Marital status: Married    Spouse name: Not on file  . Number of children: 2  . Years of education: Not on file  . Highest education level: Not on file  Occupational History  . Not on file  Social Needs  . Financial resource strain: Not on file  . Food insecurity    Worry: Not on file    Inability: Not on file  . Transportation needs    Medical: Not on file    Non-medical: Not on file  Tobacco Use  . Smoking status: Current Every Day Smoker    Packs/day: 0.50    Types: Cigarettes  . Smokeless tobacco: Never Used  Substance and Sexual Activity  . Alcohol use: Yes    Comment: rare  . Drug use: Yes    Types: Marijuana  . Sexual activity: Not on file  Lifestyle  . Physical activity    Days per week: Not on file    Minutes per session: Not on file  . Stress: Not on file  Relationships  . Social Musicianconnections    Talks on phone: Not on file    Gets together:  Not on file    Attends religious service: Not on file    Active member of club or organization: Not on file    Attends meetings of clubs or organizations: Not on file    Relationship status: Not on file  . Intimate partner violence    Fear of current or ex partner: Not on file    Emotionally abused: Not on file    Physically abused: Not on file    Forced sexual activity: Not on file  Other Topics Concern  . Not on file  Social History Narrative  . Not on file    Outpatient Medications Prior to Visit  Medication Sig Dispense Refill  . atorvastatin (LIPITOR) 20 MG tablet Take 1 tablet (20 mg total) by mouth daily. 90 tablet 3  . PARoxetine (PAXIL) 20 MG tablet Take 1 tablet (20 mg total) by mouth daily. 90 tablet 0  . aspirin 325 MG tablet Take 1 tablet (325 mg total) by mouth daily. 30 tablet 0  . triamcinolone (KENALOG) 0.025 % ointment Apply 1 application topically  2 (two) times daily. 80 g 1   No facility-administered medications prior to visit.     No Known Allergies  ROS Review of Systems  Constitutional: Negative.   Eyes: Negative for photophobia and visual disturbance.  Respiratory: Negative.   Cardiovascular: Negative.   Gastrointestinal: Negative.   Genitourinary: Negative for difficulty urinating, frequency and urgency.  Neurological: Negative.        Depression screen [facility] 2/9 03/21/2019 12/24/2018  Decreased Interest 1 1  Down, Depressed, Hopeless 2 1  PHQ - 2 Score 3 2  Altered sleeping 1 2  Tired, decreased energy 1 1  Change in appetite 2 0  Feeling bad or failure about yourself  2 2  Trouble concentrating 0 0  Moving slowly or fidgety/restless 2 0  Suicidal thoughts 0 -  PHQ-9 Score 11 7  Difficult doing work/chores Somewhat difficult -    Objective:    Physical Exam  Constitutional: He is oriented to person, place, and time. He appears well-developed and well-nourished. No distress.  HENT:  Head: Normocephalic and atraumatic.  Right Ear:  External ear normal.  Left Ear: External ear normal.  Eyes: Conjunctivae are normal. Right eye exhibits no discharge. Left eye exhibits no discharge. No scleral icterus.  Neck: No JVD present. No tracheal deviation present.  Pulmonary/Chest: Effort normal. No stridor.  Neurological: He is alert and oriented to person, place, and time.  Skin: Skin is warm and dry. He is not diaphoretic.  Psychiatric: He has a normal mood and affect. His behavior is normal.    BP 124/80   Pulse 88   Ht 6' (1.829 m)   Wt 188 lb (85.3 kg)   SpO2 98%   BMI 25.50 kg/m  Wt Readings from Last 3 Encounters:  04/09/19 185 lb (83.9 kg)  03/21/19 188 lb (85.3 kg)  01/20/19 184 lb (83.5 kg)   BP Readings from Last 3 Encounters:  04/09/19 117/79  03/21/19 124/80  01/20/19 110/70   Guideline developer:  UpToDate (see UpToDate for funding source) Date Released: June 2014  Health Maintenance Due  Topic Date Due  . Brandon [name]  01/22/1993    There are no preventive care reminders to display for this patient.  Lab Results  Component Value Date   TSH 1.679 12/10/2018   Lab Results  Component Value Date   WBC 6.3 12/09/2018   HGB 15.4 12/09/2018   HCT 46.4 12/10/2018   MCV 88.6 12/09/2018   PLT 204 12/09/2018   Lab Results  Component Value Date   NA 139 12/09/2018   K 3.7 12/09/2018   CO2 24 12/09/2018   GLUCOSE 101 (H) 12/09/2018   BUN 13 12/09/2018   CREATININE 0.95 12/09/2018   BILITOT 1.5 (H) 12/09/2018   ALKPHOS 63 12/09/2018   AST 25 12/09/2018   ALT 26 12/09/2018   PROT 7.4 12/09/2018   ALBUMIN 4.4 12/09/2018   CALCIUM 9.4 12/09/2018   ANIONGAP 10 12/09/2018   GFR 77.18 07/10/2018   Lab Results  Component Value Date   CHOL 206 (H) 12/10/2018   Lab Results  Component Value Date   HDL 36 (L) 12/10/2018   Lab Results  Component Value Date   LDLCALC 122 (H) 12/10/2018   Lab Results  Component Value Date   TRIG 242 (H) 12/10/2018   Lab Results  Component Value Date    CHOLHDL 5.7 12/10/2018   Lab Results  Component Value Date   HGBA1C 4.5 (L) 12/10/2018      Assessment &  Plan:   Problem List Items Addressed This Visit      Other   Anxiety associated with depression   Relevant Medications   buPROPion (WELLBUTRIN XL) 150 MG 24 hr tablet   PARoxetine (PAXIL) 10 MG tablet   Elevated LDL cholesterol level - Primary   Change in bowel habits   Relevant Orders   Ambulatory referral to Gastroenterology   Medication side effect      Meds ordered this encounter  Medications  . buPROPion (WELLBUTRIN XL) 150 MG 24 hr tablet    Sig: Take one in the morning daily for one week and then increase to 2 daily.    Dispense:  60 tablet    Refill:  1  . PARoxetine (PAXIL) 10 MG tablet    Sig: Take one daily for a week, then 1 every other day for a week and then stop.    Dispense:  14 tablet    Refill:  0    Follow-up: Return in about 4 weeks (around 04/18/2019).   We will taper Paxil and start Wellbutrin XL.  Advised him to note possible increased energy.  Hopefully will will make his cigarettes taste bad.  Follow-up in 4 weeks.  We also discussed the fact that he smokes and has elevated LDL cholesterol.  ED can also be associated with both of these things.  He has now explicitly aware of that.

## 2019-03-21 NOTE — Patient Instructions (Signed)
Bupropion tablets (Depression/Mood Disorders) What is this medicine? BUPROPION (byoo PROE pee on) is used to treat depression. This medicine may be used for other purposes; ask your health care provider or pharmacist if you have questions. COMMON BRAND NAME(S): Wellbutrin What should I tell my health care provider before I take this medicine? They need to know if you have any of these conditions:  an eating disorder, such as anorexia or bulimia  bipolar disorder or psychosis  diabetes or high blood sugar, treated with medication  glaucoma  heart disease, previous heart attack, or irregular heart beat  head injury or brain tumor  high blood pressure  kidney or liver disease  seizures  suicidal thoughts or a previous suicide attempt  Tourette's syndrome  weight loss  an unusual or allergic reaction to bupropion, other medicines, foods, dyes, or preservatives  breast-feeding  pregnant or trying to become pregnant How should I use this medicine? Take this medicine by mouth with a glass of water. Follow the directions on the prescription label. You can take it with or without food. If it upsets your stomach, take it with food. Take your medicine at regular intervals. Do not take your medicine more often than directed. Do not stop taking this medicine suddenly except upon the advice of your doctor. Stopping this medicine too quickly may cause serious side effects or your condition may worsen. A special MedGuide will be given to you by the pharmacist with each prescription and refill. Be sure to read this information carefully each time. Talk to your pediatrician regarding the use of this medicine in children. Special care may be needed. Overdosage: If you think you have taken too much of this medicine contact a poison control center or emergency room at once. NOTE: This medicine is only for you. Do not share this medicine with others. What if I miss a dose? If you miss a dose,  take it as soon as you can. If it is less than four hours to your next dose, take only that dose and skip the missed dose. Do not take double or extra doses. What may interact with this medicine? Do not take this medicine with any of the following medications:  linezolid  MAOIs like Azilect, Carbex, Eldepryl, Marplan, Nardil, and Parnate  methylene blue (injected into a vein)  other medicines that contain bupropion like Zyban This medicine may also interact with the following medications:  alcohol  certain medicines for anxiety or sleep  certain medicines for blood pressure like metoprolol, propranolol  certain medicines for depression or psychotic disturbances  certain medicines for HIV or AIDS like efavirenz, lopinavir, nelfinavir, ritonavir  certain medicines for irregular heart beat like propafenone, flecainide  certain medicines for Parkinson's disease like amantadine, levodopa  certain medicines for seizures like carbamazepine, phenytoin, phenobarbital  cimetidine  clopidogrel  cyclophosphamide  digoxin  furazolidone  isoniazid  nicotine  orphenadrine  procarbazine  steroid medicines like prednisone or cortisone  stimulant medicines for attention disorders, weight loss, or to stay awake  tamoxifen  theophylline  thiotepa  ticlopidine  tramadol  warfarin This list may not describe all possible interactions. Give your health care provider a list of all the medicines, herbs, non-prescription drugs, or dietary supplements you use. Also tell them if you smoke, drink alcohol, or use illegal drugs. Some items may interact with your medicine. What should I watch for while using this medicine? Tell your doctor if your symptoms do not get better or if they get worse.   Visit your doctor or healthcare provider for regular checks on your progress. Because it may take several weeks to see the full effects of this medicine, it is important to continue your  treatment as prescribed by your doctor. This medicine may cause serious skin reactions. They can happen weeks to months after starting the medicine. Contact your healthcare provider right away if you notice fevers or flu-like symptoms with a rash. The rash may be red or purple and then turn into blisters or peeling of the skin. Or, you might notice a red rash with swelling of the face, lips or lymph nodes in your neck or under your arms. Patients and their families should watch out for new or worsening thoughts of suicide or depression. Also watch out for sudden changes in feelings such as feeling anxious, agitated, panicky, irritable, hostile, aggressive, impulsive, severely restless, overly excited and hyperactive, or not being able to sleep. If this happens, especially at the beginning of treatment or after a change in dose, call your healthcare provider. Avoid alcoholic drinks while taking this medicine. Drinking excessive alcoholic beverages, using sleeping or anxiety medicines, or quickly stopping the use of these agents while taking this medicine may increase your risk for a seizure. Do not drive or use heavy machinery until you know how this medicine affects you. This medicine can impair your ability to perform these tasks. Do not take this medicine close to bedtime. It may prevent you from sleeping. Your mouth may get dry. Chewing sugarless gum or sucking hard candy, and drinking plenty of water may help. Contact your doctor if the problem does not go away or is severe. What side effects may I notice from receiving this medicine? Side effects that you should report to your doctor or health care professional as soon as possible:  allergic reactions like skin rash, itching or hives, swelling of the face, lips, or tongue  breathing problems  changes in vision  confusion  elevated mood, decreased need for sleep, racing thoughts, impulsive behavior  fast or irregular  heartbeat  hallucinations, loss of contact with reality  increased blood pressure  rash, fever, and swollen lymph nodes  redness, blistering, peeling, or loosening of the skin, including inside the mouth  seizures  suicidal thoughts or other mood changes  unusually weak or tired  vomiting Side effects that usually do not require medical attention (report to your doctor or health care professional if they continue or are bothersome):  constipation  headache  loss of appetite  nausea  tremors  weight loss This list may not describe all possible side effects. Call your doctor for medical advice about side effects. You may report side effects to FDA at 1-800-FDA-1088. Where should I keep my medicine? Keep out of the reach of children. Store at room temperature between 20 and 25 degrees C (68 and 77 degrees F), away from direct sunlight and moisture. Keep tightly closed. Throw away any unused medicine after the expiration date. NOTE: This sheet is a summary. It may not cover all possible information. If you have questions about this medicine, talk to your doctor, pharmacist, or health care provider.  2020 Elsevier/Gold Standard (2018-07-18 14:02:47)  

## 2019-04-09 ENCOUNTER — Encounter (HOSPITAL_COMMUNITY): Payer: Self-pay

## 2019-04-09 ENCOUNTER — Telehealth: Payer: 59

## 2019-04-09 ENCOUNTER — Ambulatory Visit (HOSPITAL_COMMUNITY)
Admission: EM | Admit: 2019-04-09 | Discharge: 2019-04-09 | Disposition: A | Discharge status: Home / Self Care | Payer: 59 | Attending: Urgent Care | Admitting: Urgent Care

## 2019-04-09 ENCOUNTER — Other Ambulatory Visit: Payer: Self-pay

## 2019-04-09 DIAGNOSIS — R42 Dizziness and giddiness: Secondary | ICD-10-CM

## 2019-04-09 DIAGNOSIS — H8113 Benign paroxysmal vertigo, bilateral: Secondary | ICD-10-CM

## 2019-04-09 MED ORDER — MECLIZINE HCL 12.5 MG PO TABS: 12.5000 mg | ORAL_TABLET | Freq: Three times a day (TID) | ORAL | 0 refills | Status: DC | PRN

## 2019-04-09 NOTE — ED Provider Notes (Signed)
[facility]   MRN: [id number] DOB: 02-24-1976  Subjective:   Brandon [name] is a 43 y.o. male presenting for 2 day hx of dizziness, vertigo with changing positions. Dizziness lasts several seconds and has multiple episodes per day. Has a hx of SVT but has not felt any particular palpitations.   No current facility-administered medications for this encounter.   Current Outpatient Medications:  .  atorvastatin (LIPITOR) 20 MG tablet, Take 1 tablet (20 mg total) by mouth daily., Disp: 90 tablet, Rfl: 3 .  buPROPion (WELLBUTRIN XL) 150 MG 24 hr tablet, Take one in the morning daily for one week and then increase to 2 daily., Disp: 60 tablet, Rfl: 1 .  PARoxetine (PAXIL) 10 MG tablet, Take one daily for a week, then 1 every other day for a week and then stop., Disp: 14 tablet, Rfl: 0   No Known Allergies  Past Medical History:  Diagnosis Date  . Chest pain 01/28/2018  . Palpitation   . Right knee pain      Past Surgical History:  Procedure Laterality Date  . PATELLA FRACTURE SURGERY      Family History  Problem Relation Age of Onset  . Cancer Mother        ESOPHAGEAL  . Diabetes Father   . Heart Problems Father        CARDIOMEGALY  . Healthy Brother     Social History   Tobacco Use  . Smoking status: Current Every Day Smoker    Packs/day: 0.50    Types: Cigarettes  . Smokeless tobacco: Never Used  Substance Use Topics  . Alcohol use: Yes    Comment: rare  . Drug use: Yes    Types: Marijuana    Review of Systems  Constitutional: Negative for fever and malaise/fatigue.  HENT: Negative for congestion, ear pain, sinus pain and sore throat.   Eyes: Negative for discharge and redness.  Respiratory: Negative for cough, hemoptysis, shortness of breath and wheezing.   Cardiovascular: Negative for chest pain.  Gastrointestinal: Negative for abdominal pain, diarrhea, nausea and vomiting.  Genitourinary: Negative for dysuria, flank pain and hematuria.   Musculoskeletal: Negative for myalgias.  Skin: Negative for rash.  Neurological: Positive for dizziness. Negative for weakness and headaches.  Psychiatric/Behavioral: Negative for depression and substance abuse.     Objective:   Vitals: BP 117/79 (BP Location: Right Arm)   Pulse 82   Temp 98.9 F (37.2 C)   Resp 16   Wt 185 lb (83.9 kg)   SpO2 100%   BMI 25.09 kg/m   Physical Exam Constitutional:      General: He is not in acute distress.    Appearance: Normal appearance. He is well-developed and normal weight. He is not ill-appearing, toxic-appearing or diaphoretic.  HENT:     Head: Normocephalic and atraumatic.     Right Ear: Tympanic membrane, ear canal and external ear normal. There is no impacted cerumen.     Left Ear: Tympanic membrane, ear canal and external ear normal. There is no impacted cerumen.     Nose: Nose normal. No congestion or rhinorrhea.     Mouth/Throat:     Mouth: Mucous membranes are moist.     Pharynx: No oropharyngeal exudate or posterior oropharyngeal erythema.  Eyes:     General: No scleral icterus.       Right eye: No discharge.        Left eye: No discharge.     Extraocular Movements: Extraocular movements  intact.     Conjunctiva/sclera: Conjunctivae normal.     Pupils: Pupils are equal, round, and reactive to light.  Neck:     Musculoskeletal: Normal range of motion and neck supple. No neck rigidity or muscular tenderness.  Cardiovascular:     Rate and Rhythm: Normal rate and regular rhythm.     Heart sounds: Normal heart sounds. No murmur. No friction rub. No gallop.   Pulmonary:     Effort: Pulmonary effort is normal. No respiratory distress.     Breath sounds: Normal breath sounds. No stridor. No wheezing, rhonchi or rales.  Neurological:     General: No focal deficit present.     Mental Status: He is alert and oriented to person, place, and time.  Psychiatric:        Mood and Affect: Mood normal.        Behavior: Behavior normal.         Thought Content: Thought content normal.        Judgment: Judgment normal.     Assessment and Plan :   1. Benign paroxysmal positional vertigo due to bilateral vestibular disorder   2. Dizziness   3. Vertigo     Will manage conservatively with at home Epley maneuvers, meclizine for BPPV. Counseled patient on potential for adverse effects with medications prescribed/recommended today, ER and return-to-clinic precautions discussed, patient verbalized understanding.    Jaynee Eagles, [place] 04/09/19 1933

## 2019-04-09 NOTE — ED Triage Notes (Signed)
Pt states he has been dizzy for 2 days. Pt states when he wake up ans sit up he feels dizzy and while driving some just having to turn his head right to left he gets dizzy.

## 2019-04-14 ENCOUNTER — Encounter: Payer: Self-pay | Admitting: Family Medicine

## 2019-04-15 ENCOUNTER — Encounter: Payer: Self-pay | Admitting: Gastroenterology

## 2019-04-15 DIAGNOSIS — R194 Change in bowel habit: Secondary | ICD-10-CM | POA: Insufficient documentation

## 2019-04-15 DIAGNOSIS — T887XXA Unspecified adverse effect of drug or medicament, initial encounter: Secondary | ICD-10-CM | POA: Insufficient documentation

## 2019-04-15 NOTE — Addendum Note (Signed)
Addended by: Jon Billings on: 04/15/2019 08:28 AM   Modules accepted: Orders

## 2019-04-17 ENCOUNTER — Other Ambulatory Visit: Payer: Self-pay

## 2019-04-18 ENCOUNTER — Encounter: Payer: Self-pay | Admitting: Family Medicine

## 2019-04-18 ENCOUNTER — Telehealth: Payer: Self-pay | Admitting: Family Medicine

## 2019-04-18 ENCOUNTER — Ambulatory Visit (INDEPENDENT_AMBULATORY_CARE_PROVIDER_SITE_OTHER): Payer: 59 | Admitting: Family Medicine

## 2019-04-18 VITALS — BP 128/62 | HR 85 | Temp 97.6°F | Wt 188.4 lb

## 2019-04-18 DIAGNOSIS — J381 Polyp of vocal cord and larynx: Secondary | ICD-10-CM | POA: Diagnosis not present

## 2019-04-18 DIAGNOSIS — F418 Other specified anxiety disorders: Secondary | ICD-10-CM | POA: Diagnosis not present

## 2019-04-18 MED ORDER — BUPROPION HCL ER (XL) 150 MG PO TB24: ORAL_TABLET | ORAL | 1 refills | Status: AC

## 2019-04-18 NOTE — Progress Notes (Signed)
Established Patient Office Visit  Subjective:  Patient ID: Brandon [name], male    DOB: Sep 18, 1975  Age: 43 y.o. MRN: [id number]  CC:  Chief Complaint  Patient presents with  . Follow-up    pt is here for follow up due to change in medication  pt states there has not been much of a difference and has cut back on smoking some     HPI Brandon [name] presents for follow-up of his anxiety and depression.  Self discontinued the Paxil a few weeks ago.  Sexual side effects have resolved.  Continues with the Wellbutrin.  Seems to be helping some.  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  And will be followed up in a month.  Unfortunately he has not noted a change in the taste of his cigarettes with the Wellbutrin therapy.  Hardware was removed from his right knee and it is feeling much better.  Past Medical History:  Diagnosis Date  . Chest pain 01/28/2018  . Palpitation   . Right knee pain     Past Surgical History:  Procedure Laterality Date  . PATELLA FRACTURE SURGERY      Family History  Problem Relation Age of Onset  . Cancer Mother        ESOPHAGEAL  . Diabetes Father   . Heart Problems Father        CARDIOMEGALY  . Healthy Brother     Social History   Socioeconomic History  . Marital status: Married    Spouse name: Not on file  . Number of children: 2  . Years of education: Not on file  . Highest education level: Not on file  Occupational History  . Not on file  Tobacco Use  . Smoking status: Current Every Day Smoker    Packs/day: 0.50    Types: Cigarettes  . Smokeless tobacco: Never Used  Substance and Sexual Activity  . Alcohol use: Yes    Comment: rare  . Drug use: Yes    Types: Marijuana  . Sexual activity: Not on file  Other Topics Concern  . Not on file  Social History Narrative  . Not on file   Social Determinants of Health   Financial Resource Strain:   . Difficulty of Paying Living Expenses: Not on file  Food Insecurity:   . Worried  About Charity fundraiser in the Last Year: Not on file  . Ran Out of Food in the Last Year: Not on file  Transportation Needs:   . Lack of Transportation (Medical): Not on file  . Lack of Transportation (Non-Medical): Not on file  Physical Activity:   . Days of Exercise per Week: Not on file  . Minutes of Exercise per Session: Not on file  Stress:   . Feeling of Stress : Not on file  Social Connections:   . Frequency of Communication with Friends and Family: Not on file  . Frequency of Social Gatherings with Friends and Family: Not on file  . Attends Religious Services: Not on file  . Active Member of Clubs or Organizations: Not on file  . Attends Archivist Meetings: Not on file  . Marital Status: Not on file  Intimate Partner Violence:   . Fear of Current or Ex-Partner: Not on file  . Emotionally Abused: Not on file  . Physically Abused: Not on file  . Sexually Abused: Not on file    Outpatient Medications Prior to Visit  Medication Sig Dispense Refill  .  atorvastatin (LIPITOR) 20 MG tablet Take 1 tablet (20 mg total) by mouth daily. 90 tablet 3  . buPROPion (WELLBUTRIN XL) 150 MG 24 hr tablet Take one in the morning daily for one week and then increase to 2 daily. 60 tablet 1  . meclizine (ANTIVERT) 12.5 MG tablet Take 1 tablet (12.5 mg total) by mouth 3 (three) times daily as needed for dizziness. 30 tablet 0  . PARoxetine (PAXIL) 10 MG tablet Take one daily for a week, then 1 every other day for a week and then stop. 14 tablet 0   No facility-administered medications prior to visit.    No Known Allergies  ROS Review of Systems  Constitutional: Negative.   Respiratory: Negative.   Cardiovascular: Negative.   Gastrointestinal: Negative.   Musculoskeletal: Negative for arthralgias.   [facility] 04/18/2019 03/21/2019 12/24/2018  Decreased Interest 1 1 1   Down, Depressed, Hopeless 2 2 1   PHQ - 2 Score 3 3 2   Altered sleeping 3 1 2   Tired,  decreased energy 1 1 1   Change in appetite 2 2 0  Feeling bad or failure about yourself  2 2 2   Trouble concentrating 0 0 0  Moving slowly or fidgety/restless 1 2 0  Suicidal thoughts 0 0 -  PHQ-9 Score 12 11 7   Difficult doing work/chores Somewhat difficult Somewhat difficult -      Objective:    Physical Exam  Constitutional: He is oriented to person, place, and time. He appears well-developed and well-nourished. No distress.  HENT:  Head: Normocephalic and atraumatic.  Right Ear: External ear normal.  Left Ear: External ear normal.  Neck: No JVD present. No tracheal deviation present.  Pulmonary/Chest: Effort normal. No stridor.  Neurological: He is alert and oriented to person, place, and time.  Skin: He is not diaphoretic.  Psychiatric: He has a normal mood and affect. His behavior is normal.    BP 128/62 (BP Location: Right Arm, Patient Position: Sitting, Cuff Size: Normal)   Pulse 85   Temp 97.6 F (36.4 C) (Temporal)   Wt 188 lb 6.4 oz (85.5 kg)   SpO2 99%   BMI 25.55 kg/m  Wt Readings from Last 3 Encounters:  04/18/19 188 lb 6.4 oz (85.5 kg)  04/09/19 185 lb (83.9 kg)  03/21/19 188 lb (85.3 kg)     Health Maintenance Due  Topic Date Due  . TETANUS/TDAP  01/22/1993    There are no preventive care reminders to display for this patient.  Lab Results  Component Value Date   TSH 1.679 12/10/2018   Lab Results  Component Value Date   WBC 6.3 12/09/2018   HGB 15.4 12/09/2018   HCT 46.4 12/10/2018   MCV 88.6 12/09/2018   PLT 204 12/09/2018   Lab Results  Component Value Date   NA 139 12/09/2018   K 3.7 12/09/2018   CO2 24 12/09/2018   GLUCOSE 101 (H) 12/09/2018   BUN 13 12/09/2018   CREATININE 0.95 12/09/2018   BILITOT 1.5 (H) 12/09/2018   ALKPHOS 63 12/09/2018   AST 25 12/09/2018   ALT 26 12/09/2018   PROT 7.4 12/09/2018   ALBUMIN 4.4 12/09/2018   CALCIUM 9.4 12/09/2018   ANIONGAP 10 12/09/2018   GFR 77.18 07/10/2018   Lab Results    Component Value Date   CHOL 206 (H) 12/10/2018   Lab Results  Component Value Date   HDL 36 (L) 12/10/2018   Lab Results  Component Value Date  LDLCALC 122 (H) 12/10/2018   Lab Results  Component Value Date   TRIG 242 (H) 12/10/2018   Lab Results  Component Value Date   CHOLHDL 5.7 12/10/2018   Lab Results  Component Value Date   HGBA1C 4.5 (L) 12/10/2018      Assessment & Plan:   Problem List Items Addressed This Visit    None      No orders of the defined types were placed in this encounter.   Follow-up: No follow-ups on file.   Patient will continue Wellbutrin and follow-up in 6 weeks. Mliss Sax, MD

## 2019-04-18 NOTE — Telephone Encounter (Signed)
Called and informed patient that a year supply was filled on 12/24/2018

## 2019-04-18 NOTE — Telephone Encounter (Signed)
Patient states that he needs a refill on his atorvastain 20mg . Please advise. Patient was seen today on 04/18/2019

## 2019-05-14 ENCOUNTER — Encounter: Payer: Self-pay | Admitting: Gastroenterology

## 2019-05-14 ENCOUNTER — Ambulatory Visit (INDEPENDENT_AMBULATORY_CARE_PROVIDER_SITE_OTHER): Payer: 59 | Admitting: Gastroenterology

## 2019-05-14 ENCOUNTER — Other Ambulatory Visit: Payer: Self-pay

## 2019-05-14 VITALS — BP 112/70 | HR 83 | Temp 98.3°F | Ht 72.0 in | Wt 196.4 lb

## 2019-05-14 DIAGNOSIS — Z87891 Personal history of nicotine dependence: Secondary | ICD-10-CM | POA: Diagnosis not present

## 2019-05-14 DIAGNOSIS — Z1159 Encounter for screening for other viral diseases: Secondary | ICD-10-CM

## 2019-05-14 DIAGNOSIS — Z1211 Encounter for screening for malignant neoplasm of colon: Secondary | ICD-10-CM | POA: Diagnosis not present

## 2019-05-14 MED ORDER — CLENPIQ 10-3.5-12 MG-GM -GM/160ML PO SOLN: 1.0000 | Freq: Once | ORAL | 0 refills | Status: AC

## 2019-05-14 NOTE — Progress Notes (Signed)
Chief Complaint: Colon cancer screening  Referring Provider:     Abelino Derrick Alfred,MD   HPI:    Brandon [name] is a 44 y.o. African-American male with a history of anxiety/depression, laryngeal polyp, tobacco use disorder (quit a few weeks ago), presenting to the [facility] for initial CRC screening.  He is without any active GI sxs. Denies any melena, hematochezia, nausea, vomiting, diarrhea, constipation, change in bowel habits, early satiety, or abdominal pain, and no fever, chills, night sweats or weight loss. No previous CRC screening to date.   Labs in 12/2018: Normal CBC, normal CMP aside from T bili 1.5, stable from previous (1.4) and c/w his known history of Gilbert's.  No abdominal imaging for review.  FHx n/f cousin with CRC, deceased age 51.   Past Medical History:  Diagnosis Date  . Chest pain 01/28/2018  . Palpitation   . Right knee pain      Past Surgical History:  Procedure Laterality Date  . PATELLA FRACTURE SURGERY     Family History  Problem Relation Age of Onset  . Cancer Mother        ESOPHAGEAL  . Diabetes Father   . Heart Problems Father        CARDIOMEGALY  . Healthy Brother   . Ovarian cancer Maternal Grandmother   . Colon cancer Cousin   . Prostate cancer Neg Hx    Social History   Tobacco Use  . Smoking status: Former Smoker    Packs/day: 0.50    Types: Cigarettes  . Smokeless tobacco: Never Used  . Tobacco comment: quit 04/2019  Substance Use Topics  . Alcohol use: Yes    Comment: rare  . Drug use: Yes    Types: Marijuana   Current Outpatient Medications  Medication Sig Dispense Refill  . atorvastatin (LIPITOR) 20 MG tablet Take 1 tablet (20 mg total) by mouth daily. 90 tablet 3  . buPROPion (WELLBUTRIN XL) 150 MG 24 hr tablet Take one in the morning and second dose mid afternoon. 60 tablet 1  . meclizine (ANTIVERT) 12.5 MG tablet Take 1 tablet (12.5 mg total) by mouth 3 (three) times daily as needed  for dizziness. (Patient not taking: Reported on 05/14/2019) 30 tablet 0   No current facility-administered medications for this visit.   No Known Allergies   Review of Systems: All systems reviewed and negative except where noted in HPI.     Physical Exam:    Wt Readings from Last 3 Encounters:  05/14/19 196 lb 6 oz (89.1 kg)  04/18/19 188 lb 6.4 oz (85.5 kg)  04/09/19 185 lb (83.9 kg)    BP 112/70   Pulse 83   Temp 98.3 F (36.8 C)   Ht 6' (1.829 m)   Wt 196 lb 6 oz (89.1 kg)   BMI 26.63 kg/m  Constitutional:  Pleasant, in no acute distress. Psychiatric: Normal mood and affect. Behavior is normal. EENT: Pupils normal.  Conjunctivae are normal. No scleral icterus. Neck supple. No cervical LAD. Cardiovascular: Normal rate, regular rhythm. No edema Pulmonary/chest: Effort normal and breath sounds normal. No wheezing, rales or rhonchi. Abdominal: Soft, nondistended, nontender. Bowel sounds active throughout. There are no masses palpable. No hepatomegaly. Neurological: Alert and oriented to person place and time. Skin: Skin is warm and dry. No rashes noted.   ASSESSMENT AND PLAN;   Brandon [name] is a 44 y.o. male presenting to the [facility]ology Clinic  for initial CRC screening.  Cousin with CRC.  No first-degree relatives with CRC or related malignancies, and patient is without any active GI sxs. No previous CRC screening to date. Discussed options for CRC screening, to include optical vs virtual colonoscopy - the risks and benefits and pros and cons of each, as well as discussion of FIT kit testing, Cologuard, etc, and the patient decided to proceed with an optical colonoscopy.   - Will schedule date and time for colonoscopy prior to leaving clinic today  - NPO at MN prior to procedure  - Bowel prep ordered with plan for instruction with GI clinical staff - All questions answered  The indications, risks, and benefits of colonoscopy were explained to the patient in  detail. Risks include but are not limited to bleeding, perforation, adverse reaction to medications, and cardiopulmonary compromise. Sequelae include but are not limited to the possibility of surgery, hospitalization, and mortality. The patient verbalized understanding and wished to proceed. All questions answered, referred for scheduling and bowel prep ordered. Further recommendations pending results of the exam.    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  3) History of tobacco use: -Congratulated him on recent smoking cessation   Lavena Bullion, DO, FACG  05/14/2019, 4:00 PM   Libby Maw,*

## 2019-05-14 NOTE — Patient Instructions (Signed)
You have been scheduled for a colonoscopy. Please follow written instructions given to you at your visit today.  Please pick up your prep supplies at the pharmacy within the next 1-3 days. If you use inhalers (even only as needed), please bring them with you on the day of your procedure. Your physician has requested that you go to www.startemmi.com and enter the access code given to you at your visit today. This web site gives a general overview about your procedure. However, you should still follow specific instructions given to you by our office regarding your preparation for the procedure.  It was a pleasure to see you today!  Vito Cirigliano, D.O.  

## 2019-05-15 ENCOUNTER — Encounter: Payer: Self-pay | Admitting: Gastroenterology

## 2019-05-29 ENCOUNTER — Encounter: Payer: 59 | Admitting: Gastroenterology

## 2019-05-30 ENCOUNTER — Ambulatory Visit: Payer: 59 | Admitting: Family Medicine

## 2019-06-06 ENCOUNTER — Encounter (HOSPITAL_BASED_OUTPATIENT_CLINIC_OR_DEPARTMENT_OTHER): Payer: Self-pay | Admitting: Otolaryngology

## 2019-06-06 ENCOUNTER — Other Ambulatory Visit: Payer: Self-pay

## 2019-06-12 ENCOUNTER — Other Ambulatory Visit (HOSPITAL_COMMUNITY)
Admission: RE | Admit: 2019-06-12 | Discharge: 2019-06-12 | Disposition: A | Discharge status: Home / Self Care | Payer: 59 | Source: Ambulatory Visit | Attending: Otolaryngology | Admitting: Otolaryngology

## 2019-06-12 DIAGNOSIS — Z01812 Encounter for preprocedural laboratory examination: Secondary | ICD-10-CM | POA: Insufficient documentation

## 2019-06-12 DIAGNOSIS — Z20822 Contact with and (suspected) exposure to covid-19: Secondary | ICD-10-CM | POA: Insufficient documentation

## 2019-06-12 LAB — SARS CORONAVIRUS 2 (TAT 6-24 HRS): SARS Coronavirus 2: NEGATIVE

## 2019-06-12 NOTE — H&P (Signed)
Otolaryngology Clinic Note  HPI:    Brandon [name] is a 44 y.o. male patient of Libby Maw, MD for reevaluation of vocal cord polyp.  Voice remains good.  He is having no pain.  He does not think he has any reflux.  He did stop smoking.  He does use his voice extensively at work and so is concerned about the risk of any kind of voice degradation  PMH/Meds/All/SocHx/FamHx/ROS:   Past Medical History  No past medical history on file.    Past Surgical History  No past surgical history on file.    No family history of bleeding disorders, wound healing problems or difficulty with anesthesia.   Social History  Social History        Socioeconomic History  . Marital status: Unknown    Spouse name: Not on file  . Number of children: Not on file  . Years of education: Not on file  . Highest education level: Not on file  Occupational History  . Not on file  Social Needs  . Financial resource strain: Not on file  . Food insecurity    Worry: Not on file    Inability: Not on file  . Transportation needs    Medical: Not on file    Non-medical: Not on file  Tobacco Use  . Smoking status: Former Smoker    Types: Cigarettes  . Smokeless tobacco: Never Used  Substance and Sexual Activity  . Alcohol use: Yes  . Drug use: Yes    Types: Marijuana  . Sexual activity: Not on file  Lifestyle  . Physical activity    Days per week: Not on file    Minutes per session: Not on file  . Stress: Not on file  Relationships  . Social Herbalist on phone: Not on file    Gets together: Not on file    Attends religious service: Not on file    Active member of club or organization: Not on file    Attends meetings of clubs or organizations: Not on file    Relationship status: Not on file  Other Topics Concern  . Not on file  Social History Narrative  . Not on file       Current Outpatient Medications:  .  atorvastatin  (LIPITOR) 20 MG tablet, Take 20 mg by mouth daily., Disp: , Rfl:  .  buPROPion XL (WELLBUTRIN XL) 150 MG 24 hr tablet, Take 150 mg by mouth 2 times daily., Disp: , Rfl:  .  HYDROcodone-acetaminophen (NORCO) 5-325 mg per tablet, Take 5-325 tablets by mouth every 6 (six) hours as needed., Disp: , Rfl:  .  meclizine (ANTIVERT) 12.5 mg tablet, Take 12.5 mg by mouth 3 times daily., Disp: , Rfl:  .  PARoxetine (PAXIL) 20 MG tablet, Take 20 mg by mouth daily., Disp: , Rfl:  .  triamcinolone (KENALOG) 0.025 % ointment, Apply 6.237 application topically 2 times daily., Disp: , Rfl:   A complete ROS was performed with pertinent positives/negatives noted in the HPI. The remainder of the ROS are negative.    Physical Exam:    BP 128/80 (Site: Left arm, Position: Sitting)   Pulse 69   Temp 97.5 F (36.4 C)   Ht 1.829 m (6')   Wt 86.5 kg (190 lb 12.8 oz)   BMI 25.88 kg/m  He is stocky and healthy.  His voice is loud and clear with good resonance.  Anterior nose is clear.  Oral  cavity is moist with teeth in good repair.  I could not examine the larynx with mirror.  Using the flexible laryngoscope, there is once again a small pedunculated polyp coming off the free edge of the anterior left vocal cord.  It may be slightly smaller in size than last time.  It is not causing any airway obstruction.    Flexible Laryngoscopy   Indications were discussed.  Details of the procedure were explained.  Questions were answered and informed consent was obtained verbally.    Technique:  After anesthetizing the nasal cavity with topical lidocaine and oxymetazoline, the flexible endoscope was introduced and passed through the left nasal cavity into the nasopharynx. The scope was withdrawn from the nose. He tolerated the procedure well.     Impression & Plans:   Persistent left vocal cord polyp.  Plan: This is not causing him much trouble.  I also do not think it is cancer.  Nevertheless, I think  he would be happier if we remove this.  We will plan on microlaryngoscopy with laser excision in the near future.  I will see him back immediately preoperatively.   Fernande Boyden, MD  05/22/2019

## 2019-06-16 ENCOUNTER — Other Ambulatory Visit: Payer: Self-pay

## 2019-06-16 ENCOUNTER — Ambulatory Visit (HOSPITAL_BASED_OUTPATIENT_CLINIC_OR_DEPARTMENT_OTHER): Payer: 59 | Admitting: Certified Registered"

## 2019-06-16 ENCOUNTER — Encounter (HOSPITAL_BASED_OUTPATIENT_CLINIC_OR_DEPARTMENT_OTHER)
Admission: RE | Disposition: A | Discharge status: Home / Self Care | Payer: Self-pay | Source: Home / Self Care | Attending: Otolaryngology

## 2019-06-16 ENCOUNTER — Encounter (HOSPITAL_BASED_OUTPATIENT_CLINIC_OR_DEPARTMENT_OTHER): Payer: Self-pay | Admitting: Otolaryngology

## 2019-06-16 ENCOUNTER — Ambulatory Visit (HOSPITAL_BASED_OUTPATIENT_CLINIC_OR_DEPARTMENT_OTHER)
Admission: RE | Admit: 2019-06-16 | Discharge: 2019-06-16 | Disposition: A | Discharge status: Home / Self Care | Payer: 59 | Attending: Otolaryngology | Admitting: Otolaryngology

## 2019-06-16 DIAGNOSIS — F329 Major depressive disorder, single episode, unspecified: Secondary | ICD-10-CM | POA: Insufficient documentation

## 2019-06-16 DIAGNOSIS — Z79899 Other long term (current) drug therapy: Secondary | ICD-10-CM | POA: Diagnosis not present

## 2019-06-16 DIAGNOSIS — F419 Anxiety disorder, unspecified: Secondary | ICD-10-CM | POA: Insufficient documentation

## 2019-06-16 DIAGNOSIS — J381 Polyp of vocal cord and larynx: Secondary | ICD-10-CM | POA: Diagnosis not present

## 2019-06-16 DIAGNOSIS — Z87891 Personal history of nicotine dependence: Secondary | ICD-10-CM | POA: Diagnosis present

## 2019-06-16 DIAGNOSIS — E785 Hyperlipidemia, unspecified: Secondary | ICD-10-CM | POA: Diagnosis not present

## 2019-06-16 HISTORY — DX: Personal history of other diseases of the circulatory system: Z86.79

## 2019-06-16 HISTORY — DX: Other diseases of vocal cords: J38.3

## 2019-06-16 HISTORY — DX: Anxiety disorder, unspecified: F41.9

## 2019-06-16 HISTORY — PX: MICROLARYNGOSCOPY: SHX5208

## 2019-06-16 SURGERY — MICROLARYNGOSCOPY
Anesthesia: General | Site: Mouth | Laterality: Right

## 2019-06-16 MED ORDER — PROPOFOL 10 MG/ML IV BOLUS
INTRAVENOUS | Status: DC | PRN
  Administered 2019-06-16: 200 mg via INTRAVENOUS

## 2019-06-16 MED ORDER — MIDAZOLAM HCL 2 MG/2ML IJ SOLN
INTRAMUSCULAR | Status: AC
  Filled 2019-06-16: qty 2

## 2019-06-16 MED ORDER — FENTANYL CITRATE (PF) 100 MCG/2ML IJ SOLN
INTRAMUSCULAR | Status: AC
  Filled 2019-06-16: qty 2

## 2019-06-16 MED ORDER — EPINEPHRINE PF 1 MG/ML IJ SOLN
INTRAMUSCULAR | Status: AC
  Filled 2019-06-16: qty 1

## 2019-06-16 MED ORDER — ROCURONIUM BROMIDE 10 MG/ML (PF) SYRINGE
PREFILLED_SYRINGE | INTRAVENOUS | Status: AC
  Filled 2019-06-16: qty 10

## 2019-06-16 MED ORDER — ONDANSETRON HCL 4 MG/2ML IJ SOLN: 4.0000 mg | Freq: Once | INTRAMUSCULAR | Status: DC | PRN

## 2019-06-16 MED ORDER — ONDANSETRON HCL 4 MG/2ML IJ SOLN
INTRAMUSCULAR | Status: DC | PRN
  Administered 2019-06-16: 4 mg via INTRAVENOUS

## 2019-06-16 MED ORDER — METHYLENE BLUE 0.5 % INJ SOLN
INTRAVENOUS | Status: AC
  Filled 2019-06-16: qty 10

## 2019-06-16 MED ORDER — LIDOCAINE HCL (CARDIAC) PF 100 MG/5ML IV SOSY
PREFILLED_SYRINGE | INTRAVENOUS | Status: DC | PRN
  Administered 2019-06-16: 100 mg via INTRAVENOUS

## 2019-06-16 MED ORDER — OXYCODONE HCL 5 MG/5ML PO SOLN: 5.0000 mg | Freq: Once | ORAL | Status: DC | PRN

## 2019-06-16 MED ORDER — LACTATED RINGERS IV SOLN: INTRAVENOUS | Status: DC

## 2019-06-16 MED ORDER — LIDOCAINE 2% (20 MG/ML) 5 ML SYRINGE
INTRAMUSCULAR | Status: AC
  Filled 2019-06-16: qty 5

## 2019-06-16 MED ORDER — SUGAMMADEX SODIUM 200 MG/2ML IV SOLN
INTRAVENOUS | Status: DC | PRN
  Administered 2019-06-16: 200 mg via INTRAVENOUS

## 2019-06-16 MED ORDER — FENTANYL CITRATE (PF) 100 MCG/2ML IJ SOLN
25.0000 ug | INTRAMUSCULAR | Status: DC | PRN
  Administered 2019-06-16: 50 ug via INTRAVENOUS
  Administered 2019-06-16: 25 ug via INTRAVENOUS

## 2019-06-16 MED ORDER — MIDAZOLAM HCL 5 MG/5ML IJ SOLN
INTRAMUSCULAR | Status: DC | PRN
  Administered 2019-06-16: 2 mg via INTRAVENOUS

## 2019-06-16 MED ORDER — OXYCODONE HCL 5 MG PO TABS: 5.0000 mg | ORAL_TABLET | Freq: Once | ORAL | Status: DC | PRN

## 2019-06-16 MED ORDER — PROPOFOL 10 MG/ML IV BOLUS
INTRAVENOUS | Status: AC
  Filled 2019-06-16: qty 40

## 2019-06-16 MED ORDER — GLYCOPYRROLATE 0.2 MG/ML IJ SOLN
INTRAMUSCULAR | Status: DC | PRN
  Administered 2019-06-16: .2 mg via INTRAVENOUS

## 2019-06-16 MED ORDER — FENTANYL CITRATE (PF) 100 MCG/2ML IJ SOLN
INTRAMUSCULAR | Status: DC | PRN
  Administered 2019-06-16: 100 ug via INTRAVENOUS

## 2019-06-16 MED ORDER — ROCURONIUM BROMIDE 100 MG/10ML IV SOLN
INTRAVENOUS | Status: DC | PRN
  Administered 2019-06-16: 60 mg via INTRAVENOUS

## 2019-06-16 MED ORDER — DEXAMETHASONE SODIUM PHOSPHATE 4 MG/ML IJ SOLN
INTRAMUSCULAR | Status: DC | PRN
  Administered 2019-06-16: 10 mg via INTRAVENOUS

## 2019-06-16 SURGICAL SUPPLY — 24 items
BNDG EYE OVAL (GAUZE/BANDAGES/DRESSINGS) ×8 IMPLANT
CANISTER SUCT 1200ML W/VALVE (MISCELLANEOUS) ×4 IMPLANT
COVER WAND RF STERILE (DRAPES) IMPLANT
DEPRESSOR TONGUE BLADE STERILE (MISCELLANEOUS) ×4 IMPLANT
FILTER 7/8 IN (FILTER) ×4 IMPLANT
GAUZE SPONGE 4X4 12PLY STRL LF (GAUZE/BANDAGES/DRESSINGS) ×8 IMPLANT
GLOVE ECLIPSE 8.0 STRL XLNG CF (GLOVE) ×4 IMPLANT
GOWN STRL REUS W/ TWL LRG LVL3 (GOWN DISPOSABLE) ×2 IMPLANT
GOWN STRL REUS W/ TWL XL LVL3 (GOWN DISPOSABLE) ×2 IMPLANT
GOWN STRL REUS W/TWL LRG LVL3 (GOWN DISPOSABLE) ×2
GOWN STRL REUS W/TWL XL LVL3 (GOWN DISPOSABLE) ×2
GUARD TEETH (MISCELLANEOUS) IMPLANT
MARKER SKIN DUAL TIP RULER LAB (MISCELLANEOUS) IMPLANT
NEEDLE SPNL 22GX7 QUINCKE BK (NEEDLE) IMPLANT
NS IRRIG 1000ML POUR BTL (IV SOLUTION) ×4 IMPLANT
PACK BASIN DAY SURGERY FS (CUSTOM PROCEDURE TRAY) ×4 IMPLANT
PATTIES SURGICAL .5 X3 (DISPOSABLE) ×4 IMPLANT
REDUCTION FITTING 1/4 IN (FILTER) ×4 IMPLANT
SHEET MEDIUM DRAPE 40X70 STRL (DRAPES) ×4 IMPLANT
SLEEVE SCD COMPRESS KNEE MED (MISCELLANEOUS) ×4 IMPLANT
SOLUTION BUTLER CLEAR DIP (MISCELLANEOUS) ×4 IMPLANT
TOWEL GREEN STERILE FF (TOWEL DISPOSABLE) ×4 IMPLANT
TUBE CONNECTING 20'X1/4 (TUBING) ×1
TUBE CONNECTING 20X1/4 (TUBING) ×3 IMPLANT

## 2019-06-16 NOTE — Anesthesia Preprocedure Evaluation (Signed)
Anesthesia Evaluation  Patient identified by MRN, date of birth, ID band Patient awake    Reviewed: Allergy & Precautions, NPO status , Patient's Chart, lab work & pertinent test results  History of Anesthesia Complications Negative for: history of anesthetic complications  Airway Mallampati: II  TM Distance: >3 FB Neck ROM: Full    Dental  (+) Teeth Intact   Pulmonary neg pulmonary ROS, former smoker,    Pulmonary exam normal        Cardiovascular negative cardio ROS Normal cardiovascular exam     Neuro/Psych PSYCHIATRIC DISORDERS Anxiety Depression negative neurological ROS     GI/Hepatic negative GI ROS, Neg liver ROS,   Endo/Other  negative endocrine ROS  Renal/GU negative Renal ROS  negative genitourinary   Musculoskeletal negative musculoskeletal ROS (+)   Abdominal   Peds  Hematology negative hematology ROS (+)   Anesthesia Other Findings Vocal cord polyp HLD  Reproductive/Obstetrics                             Anesthesia Physical Anesthesia Plan  ASA: II  Anesthesia Plan: General   Post-op Pain Management:    Induction: Intravenous  PONV Risk Score and Plan: 2 and Ondansetron, Dexamethasone, Treatment may vary due to age or medical condition and Midazolam  Airway Management Planned: Oral ETT  Additional Equipment: None  Intra-op Plan:   Post-operative Plan: Extubation in OR  Informed Consent: I have reviewed the patients History and Physical, chart, labs and discussed the procedure including the risks, benefits and alternatives for the proposed anesthesia with the patient or authorized representative who has indicated his/her understanding and acceptance.     Dental advisory given  Plan Discussed with:   Anesthesia Plan Comments:         Anesthesia Quick Evaluation

## 2019-06-16 NOTE — Anesthesia Procedure Notes (Signed)
Procedure Name: Intubation Performed by: Verita Lamb, CRNA Pre-anesthesia Checklist: Patient identified, Emergency Drugs available, Suction available, Patient being monitored and Timeout performed Patient Re-evaluated:Patient Re-evaluated prior to induction Oxygen Delivery Method: Circle system utilized Preoxygenation: Pre-oxygenation with 100% oxygen Induction Type: IV induction Ventilation: Mask ventilation without difficulty Laryngoscope Size: Mac, 4 and Glidescope Grade View: Grade I Tube type: Oral Laser Tube: Cuffed inflated with minimal occlusive pressure - saline Tube size: 4.5 mm Number of attempts: 1 Airway Equipment and Method: Stylet and Oral airway Placement Confirmation: ETT inserted through vocal cords under direct vision,  positive ETCO2 and breath sounds checked- equal and bilateral Tube secured with: Tape Dental Injury: Teeth and Oropharynx as per pre-operative assessment  Comments: Laser safe ett used. Distal cuff inflated with methyline blue 6m and proximal cuff inflated with ns. Oxygen kept at 30 percent.  Attempted with mac 4 blade first and could only see epiglottis.  Attempted with glidescope second and could see a grade I view.  bbs.

## 2019-06-16 NOTE — Transfer of Care (Signed)
Immediate Anesthesia Transfer of Care Note  Patient: Brandon [name]  Procedure(s) Performed: MICROLARYNGOSCOPY WITH CO2 LASER AND EXCISION OF VOCAL CORD LESION (N/A Mouth)  Patient Location: PACU  Anesthesia Type:General  Level of Consciousness: awake, alert  and oriented  Airway & Oxygen Therapy: Patient Spontanous Breathing and Patient connected to face mask oxygen  Post-op Assessment: Report given to RN and Post -op Vital signs reviewed and stable  Post vital signs: Reviewed and stable  Last Vitals:  Vitals Value Taken Time  BP 157/108 06/16/19 1048  Temp    Pulse    Resp 16 06/16/19 1050  SpO2    Vitals shown include unvalidated device data.  Last Pain:  Vitals:   06/16/19 0920  TempSrc: Oral  PainSc: 0-No pain      Patients Stated Pain Goal: 3 (06/16/19 0920)  Complications: No apparent anesthesia complications

## 2019-06-16 NOTE — Op Note (Signed)
06/16/2019  10:46 AM    [name], Brandon Bloomer  [id number]   Pre-Op Dx: Left vocal cord polyp  Post-op Dx: Right vocal cord polyp  Proc: Microlaryngoscopy with vocal cord stripping  Surg:  [name] T MD  Anes:  GOT  EBL: None  Comp: None  Findings: A small bilobed translucent pedunculated polyp on the free edge of the right vocal cord anteriorly.  Relatively difficult access given a tight jaw and a stiff neck.  Proc: MicroDirect laryngoscopy with Biopsy  Procedure: With the patient in a comfortable supine position, GOT anesthesia was induced without difficulty.  At an appropriate level, the table was turned 90 degrees away from Anesthesia.  A clean preparation and draping was performed in the standard fashion.  A surgical time out was obtained in the standard fashion.  A rubber tooth guard was placed.     The laser anterior commissure laryngoscope was introduced taking care to protect lips, teeth, and endotracheal tube.  It was not possible to see all the way to the anterior commissure.  The laryngoscope was removed.  The Hollinger laryngoscope was introduced.  The lesion was visualized.  The scope was suspended in the standard fashion.  Afrin solution on a cotton pledget was placed against the lesion for intraoperative hemostasis.  Several minutes were allowed for this to take effect.  The cotton pledget was removed.  Zero 0 degree Hopkins rod telescope was used to take a photograph.  Working under microscopic vision, and under direct vision, the lesion was distracted to the left and peeled from the vocal cord using an up scissors and a cup forceps.  Hemostasis was observed.  The specimen was sent for pathologic interpretation.  An Afrin pledget was placed once again.  After removal, postop photographs were taken.  At this point the procedure was completed. The tooth guard was removed.   Dental status was intact.  The patient was returned to Anesthesia, awakened, extubated,  and transferred to PACU in satisfactory condition.   Dispo:   PACU to home  Plan: Relative voice rest.  Antireflux therapy.  Return visit my office 2 weeks.  Cephus Richer MD

## 2019-06-16 NOTE — Interval H&P Note (Signed)
History and Physical Interval Note:  06/16/2019 9:50 AM  Brandon [name]  has presented today for surgery, with the diagnosis of vocal cord lesion.  The various methods of treatment have been discussed with the patient and family. After consideration of risks, benefits and other options for treatment, the patient has consented to  Procedure(s): MICROLARYNGOSCOPY WITH CO2 LASER AND EXCISION OF VOCAL CORD LESION (N/A) as a surgical intervention.  The patient's history has been re-reviewed, patient re-examined, no change in status, stable for surgery.  I have re-reviewed the patient's chart and labs.  Questions were answered to the patient's satisfaction.     [name] [facility]

## 2019-06-16 NOTE — Discharge Instructions (Signed)
Use your voice gently until it works easily.   Drink plenty of fluids Ibuprofen for pain Recheck my office 2 weeks please, [phone]    Post Anesthesia Home Care Instructions  Activity: Get plenty of rest for the remainder of the day. A responsible individual must stay with you for 24 hours following the procedure.  For the next 24 hours, DO NOT: -Drive a car -Advertising copywriter -Drink alcoholic beverages -Take any medication unless instructed by your physician -Make any legal decisions or sign important papers.  Meals: Start with liquid foods such as gelatin or soup. Progress to regular foods as tolerated. Avoid greasy, spicy, heavy foods. If nausea and/or vomiting occur, drink only clear liquids until the nausea and/or vomiting subsides. Call your physician if vomiting continues.  Special Instructions/Symptoms: Your throat may feel dry or sore from the anesthesia or the breathing tube placed in your throat during surgery. If this causes discomfort, gargle with warm salt water. The discomfort should disappear within 24 hours.  If you had a scopolamine patch placed behind your ear for the management of post- operative nausea and/or vomiting:  1. The medication in the patch is effective for 72 hours, after which it should be removed.  Wrap patch in a tissue and discard in the trash. Wash hands thoroughly with soap and water. 2. You may remove the patch earlier than 72 hours if you experience unpleasant side effects which may include dry mouth, dizziness or visual disturbances. 3. Avoid touching the patch. Wash your hands with soap and water after contact with the patch.

## 2019-06-16 NOTE — Anesthesia Postprocedure Evaluation (Signed)
Anesthesia Post Note  Patient: Brandon [name]  Procedure(s) Performed: MICROLARYNGOSCOPY WITH CO2 LASER AND EXCISION OF VOCAL CORD LESION (N/A Mouth)     Patient location during evaluation: PACU Anesthesia Type: General Level of consciousness: awake and alert Pain management: pain level controlled Vital Signs Assessment: post-procedure vital signs reviewed and stable Respiratory status: spontaneous breathing, nonlabored ventilation and respiratory function stable Cardiovascular status: blood pressure returned to baseline and stable Postop Assessment: no apparent nausea or vomiting Anesthetic complications: no    Last Vitals:  Vitals:   06/16/19 1145 06/16/19 1200  BP: (!) 144/108 (!) 144/95  Pulse: 62 (!) 59  Resp: 12 16  Temp:  36.7 C  SpO2: 99% 100%    Last Pain:  Vitals:   06/16/19 1200  TempSrc:   PainSc: 5                  Lucretia Kern

## 2019-06-17 ENCOUNTER — Encounter: Payer: Self-pay | Admitting: *Deleted

## 2019-06-17 LAB — SURGICAL PATHOLOGY

## 2019-06-20 ENCOUNTER — Ambulatory Visit: Payer: 59 | Admitting: Family Medicine

## 2019-06-26 ENCOUNTER — Encounter: Payer: 59 | Admitting: Gastroenterology

## 2019-07-01 ENCOUNTER — Ambulatory Visit: Payer: 59 | Admitting: Family Medicine

## 2019-07-02 ENCOUNTER — Encounter: Payer: Self-pay | Admitting: Family Medicine

## 2019-07-02 ENCOUNTER — Ambulatory Visit (INDEPENDENT_AMBULATORY_CARE_PROVIDER_SITE_OTHER): Payer: 59 | Admitting: Family Medicine

## 2019-07-02 ENCOUNTER — Other Ambulatory Visit: Payer: Self-pay

## 2019-07-02 VITALS — BP 127/80 | HR 61 | Ht 72.0 in | Wt 190.0 lb

## 2019-07-02 DIAGNOSIS — M533 Sacrococcygeal disorders, not elsewhere classified: Secondary | ICD-10-CM

## 2019-07-02 DIAGNOSIS — S82001S Unspecified fracture of right patella, sequela: Secondary | ICD-10-CM

## 2019-07-02 DIAGNOSIS — S82001D Unspecified fracture of right patella, subsequent encounter for closed fracture with routine healing: Secondary | ICD-10-CM

## 2019-07-02 NOTE — Progress Notes (Signed)
Brandon [name] - 44 y.o. male MRN [id number]  Date of birth: 03-08-1976  SUBJECTIVE:  Including CC & ROS.  Chief Complaint  Patient presents with  . Follow-up    follow up for right knee    Brandon [name] is a 44 y.o. male that is following up for his right knee pain.  He is presenting with new coccyx pain.  He had hardware removed out of the patella that was causing him some irritation.  He denies any significant knee pain but has some strength difference between the right thigh and the left thigh.  He wants to be more active with inline skating and cycling.  He fell on his coccyx a few days ago.  Having some pain and tenderness at the tip of the sacrum.  No numbness or tingling.   Review of Systems See HPI   HISTORY: Past Medical, Surgical, Social, and Family History Reviewed & Updated per EMR.   Pertinent Historical Findings include:  Past Medical History:  Diagnosis Date  . Anxiety   . History of PSVT (paroxysmal supraventricular tachycardia)   . Lesion of vocal cord   . Palpitation   . Right knee pain     Past Surgical History:  Procedure Laterality Date  . MICROLARYNGOSCOPY Right 06/16/2019   Procedure: MICROLARYNGOSCOPY WITH EXCISION OF VOCAL CORD LESION;  Surgeon: Jodi Marble, MD;  Location: [facility];  Service: ENT;  Laterality: Right;  . PATELLA FRACTURE SURGERY      Family History  Problem Relation Age of Onset  . Cancer Mother        ESOPHAGEAL  . Diabetes Father   . Heart Problems Father        CARDIOMEGALY  . Healthy Brother   . Ovarian cancer Maternal Grandmother   . Colon cancer Cousin   . Prostate cancer Neg Hx     Social History   Socioeconomic History  . Marital status: Married    Spouse name: Not on file  . Number of children: 2  . Years of education: Not on file  . Highest education level: Not on file  Occupational History  . Not on file  Tobacco Use  . Smoking status: Former Smoker    Packs/day: 0.50    Types: Cigarettes   . Smokeless tobacco: Never Used  . Tobacco comment: quit 04/2019  Substance and Sexual Activity  . Alcohol use: Yes    Comment: rare  . Drug use: Yes    Types: Marijuana    Comment: rare  . Sexual activity: Not on file  Other Topics Concern  . Not on file  Social History Narrative  . Not on file   Social Determinants of Health   Financial Resource Strain:   . Difficulty of Paying Living Expenses: Not on file  Food Insecurity:   . Worried About Charity fundraiser in the Last Year: Not on file  . Ran Out of Food in the Last Year: Not on file  Transportation Needs:   . Lack of Transportation (Medical): Not on file  . Lack of Transportation (Non-Medical): Not on file  Physical Activity:   . Days of Exercise per Week: Not on file  . Minutes of Exercise per Session: Not on file  Stress:   . Feeling of Stress : Not on file  Social Connections:   . Frequency of Communication with Friends and Family: Not on file  . Frequency of Social Gatherings with Friends and Family: Not on file  .  Attends Religious Services: Not on file  . Active Member of Clubs or Organizations: Not on file  . Attends Banker Meetings: Not on file  . Marital Status: Not on file  Intimate Partner Violence:   . Fear of Current or Ex-Partner: Not on file  . Emotionally Abused: Not on file  . Physically Abused: Not on file  . Sexually Abused: Not on file     PHYSICAL EXAM:  VS: BP 127/80   Pulse 61   Ht 6' (1.829 m)   Wt 190 lb (86.2 kg)   BMI 25.77 kg/m  Physical Exam Gen: NAD, alert, cooperative with exam, well-appearing MSK:  Right knee: No obvious effusion. Normal range of motion. No instability. Normal strength resistance. Neurovascularly intact     ASSESSMENT & PLAN:   Right patella fracture Has had hardware removed and is doing well.  Feels that he does not have the strength in the quad on the right when compared to the left.  Is wanting to get into cycling and inline  skating more. -Counseled on home exercise therapy and supportive care. -Referral to physical therapy.  Coccyx pain He fell recently and landed on his coccyx.   -Counseled on supportive care. -Can consider x-ray or medication if ongoing.

## 2019-07-02 NOTE — Patient Instructions (Signed)
Good to see you You could try Tana Conch or Jacquiline Doe.  Physical therapy will give you a call   Please send me a message in MyChart with any questions or updates.  Please see [place] back as needed.   --Dr. Jordan Likes

## 2019-07-02 NOTE — Assessment & Plan Note (Signed)
He fell recently and landed on his coccyx.   -Counseled on supportive care. -Can consider x-ray or medication if ongoing.

## 2019-07-02 NOTE — Assessment & Plan Note (Signed)
Has had hardware removed and is doing well.  Feels that he does not have the strength in the quad on the right when compared to the left.  Is wanting to get into cycling and inline skating more. -Counseled on home exercise therapy and supportive care. -Referral to physical therapy.

## 2019-07-21 ENCOUNTER — Telehealth: Payer: Self-pay | Admitting: Gastroenterology

## 2019-07-21 NOTE — Telephone Encounter (Signed)
Hey Dr. Barron Alvine- This patient called to cancel his colonoscopy for tomorrow 3/16- he states that he missed his covid screening test and wanted to call back to reschedule. Do you want to charge him?

## 2019-07-22 ENCOUNTER — Encounter: Payer: 59 | Admitting: Gastroenterology

## 2019-07-22 NOTE — Telephone Encounter (Signed)
Yes. Given the short notice and rationale provided, reasonable to charge for missed appt. Thanks.

## 2019-07-23 NOTE — Progress Notes (Signed)
Virtual Visit via Video Note   This visit type was conducted due to national recommendations for restrictions regarding the COVID-19 Pandemic (e.g. social distancing) in an effort to limit this patient's exposure and mitigate transmission in our community.  Due to his co-morbid illnesses, this patient is at least at moderate risk for complications without adequate follow up.  This format is felt to be most appropriate for this patient at this time.  All issues noted in this document were discussed and addressed.  A limited physical exam was performed with this format.  Please refer to the patient's chart for his consent to telehealth for [facility].   Date:  08/05/2019   ID:  Brandon [name], DOB 08/22/1975, MRN [id number]  Patient Location:Home Provider Location: Home  PCP:  Mliss Sax, MD  Cardiologist:  Dr Jens Som  Evaluation Performed:  Follow-Up Visit  Chief Complaint:  FU SVT  History of Present Illness:    Seen previously for palpitations. Echocardiogram August 2020 showed normal LV systolic function. Carotid Dopplers August 2020 showed no significant stenosis.  Electrocardiogram September 2019 showed probable ectopic atrial tachycardia versus AVRT.  Seen by Dr. Ladona Ridgel and placed on beta-blocker with plans for follow-up.  Since last seen patient denies dyspnea, chest pain, palpitations or syncope.  He is not taking his beta-blocker.  The patient does not have symptoms concerning for COVID-19 infection (fever, chills, cough, or new shortness of breath).    Past Medical History:  Diagnosis Date  . Anxiety   . History of PSVT (paroxysmal supraventricular tachycardia)   . Lesion of vocal cord   . Palpitation   . Right knee pain    Past Surgical History:  Procedure Laterality Date  . MICROLARYNGOSCOPY Right 06/16/2019   Procedure: MICROLARYNGOSCOPY WITH EXCISION OF VOCAL CORD LESION;  Surgeon: [name], MD;  Location: [facility];  Service:  ENT;  Laterality: Right;  . PATELLA FRACTURE SURGERY       Current Meds  Medication Sig  . atorvastatin (LIPITOR) 20 MG tablet Take 1 tablet (20 mg total) by mouth daily.  Marland Kitchen buPROPion (WELLBUTRIN XL) 150 MG 24 hr tablet Take one in the morning and second dose mid afternoon.     Allergies:   Patient has no known allergies.   Social History   Tobacco Use  . Smoking status: Former Smoker    Packs/day: 0.50    Types: Cigarettes  . Smokeless tobacco: Never Used  . Tobacco comment: quit 04/2019  Substance Use Topics  . Alcohol use: Yes    Comment: rare  . Drug use: Yes    Types: Marijuana    Comment: rare     Family Hx: The patient's family history includes Cancer in his mother; Colon cancer in his cousin; Diabetes in his father; Healthy in his brother; Heart Problems in his father; Ovarian cancer in his maternal grandmother. There is no history of Prostate cancer.  ROS:   Please see the history of present illness.    No Fever, chills  or productive cough All other systems reviewed and are negative.  Recent Labs: 12/09/2018: ALT 26; BUN 13; Creatinine, Ser 0.95; Hemoglobin 15.4; Platelets 204; Potassium 3.7; Sodium 139 12/10/2018: TSH 1.679   Recent Lipid Panel Lab Results  Component Value Date/Time   CHOL 206 (H) 12/10/2018 03:49 AM   TRIG 242 (H) 12/10/2018 03:49 AM   HDL 36 (L) 12/10/2018 03:49 AM   CHOLHDL 5.7 12/10/2018 03:49 AM   LDLCALC 122 (H) 12/10/2018  03:49 AM    Wt Readings from Last 3 Encounters:  08/05/19 187 lb (84.8 kg)  07/02/19 190 lb (86.2 kg)  06/16/19 194 lb 10.7 oz (88.3 kg)     Objective:    Vital Signs:  Ht 6' (1.829 m)   Wt 187 lb (84.8 kg)   BMI 25.36 kg/m    VITAL SIGNS:  reviewed NAD Answers questions appropriately Normal affect Remainder of physical examination not performed (telehealth visit; coronavirus pandemic)  ASSESSMENT & PLAN:    1. SVT-felt likely to be atrial tachycardia versus AVRT.  He has not had recurrent symptoms  at present.  He is not taking his Toprol.  I asked him to take this as needed.  Can consider ablation in the future if symptoms become more frequent. 2. Tobacco abuse-patient has discontinued. 3. Hyperlipidemia-continue Lipitor.  Check lipids and liver. 4. Depression-Per primary care.  COVID-19 Education: The importance of social distancing was discussed today.  Time:   Today, I have spent 14 minutes with the patient with telehealth technology discussing the above problems.     Medication Adjustments/Labs and Tests Ordered: Current medicines are reviewed at length with the patient today.  Concerns regarding medicines are outlined above.   Tests Ordered: No orders of the defined types were placed in this encounter.   Medication Changes: No orders of the defined types were placed in this encounter.   Follow Up:  Either In Person or Virtual in 1 year(s)  Signed, Kirk Ruths, MD  08/05/2019 8:49 AM    [facility]

## 2019-08-05 ENCOUNTER — Encounter: Payer: Self-pay | Admitting: Cardiology

## 2019-08-05 ENCOUNTER — Telehealth (INDEPENDENT_AMBULATORY_CARE_PROVIDER_SITE_OTHER): Payer: 59 | Admitting: Cardiology

## 2019-08-05 VITALS — Ht 72.0 in | Wt 187.0 lb

## 2019-08-05 DIAGNOSIS — E7800 Pure hypercholesterolemia, unspecified: Secondary | ICD-10-CM | POA: Diagnosis not present

## 2019-08-05 DIAGNOSIS — I471 Supraventricular tachycardia: Secondary | ICD-10-CM

## 2019-08-05 NOTE — Patient Instructions (Signed)
Medication Instructions:  NO CHANGE *If you need a refill on your cardiac medications before your next appointment, please call your pharmacy*   Lab Work: Your physician recommends that you return for lab work PRIOR TO EATING  If you have labs (blood work) drawn today and your tests are completely normal, you will receive your results only by: . MyChart Message (if you have MyChart) OR . A paper copy in the mail If you have any lab test that is abnormal or we need to change your treatment, we will call you to review the results.   Follow-Up: At [facility], you and your health needs are our priority.  As part of our continuing mission to provide you with exceptional heart care, we have created designated Provider Care Teams.  These Care Teams include your primary Cardiologist (physician) and Advanced Practice Providers (APPs -  Physician Assistants and Nurse Practitioners) who all work together to provide you with the care you need, when you need it.  We recommend signing up for the patient portal called "MyChart".  Sign up information is provided on this After Visit Summary.  MyChart is used to connect with patients for Virtual Visits (Telemedicine).  Patients are able to view lab/test results, encounter notes, upcoming appointments, etc.  Non-urgent messages can be sent to your provider as well.   To learn more about what you can do with MyChart, go to [email or web address].    Your next appointment:   12 month(s)  The format for your next appointment:   Either In Person or Virtual  Provider:   You may see Brian Crenshaw, MD or one of the following Advanced Practice Providers on your designated Care Team:    Luke Kilroy, PA-C  Callie Goodrich, PA-C  Jesse Cleaver, FNP      

## 2019-08-10 ENCOUNTER — Encounter: Payer: Self-pay | Admitting: Family Medicine

## 2019-09-08 ENCOUNTER — Encounter: Payer: Self-pay | Admitting: Family Medicine

## 2019-11-10 ENCOUNTER — Encounter (HOSPITAL_BASED_OUTPATIENT_CLINIC_OR_DEPARTMENT_OTHER): Payer: Self-pay

## 2019-11-10 ENCOUNTER — Emergency Department (HOSPITAL_BASED_OUTPATIENT_CLINIC_OR_DEPARTMENT_OTHER)
Admission: EM | Admit: 2019-11-10 | Discharge: 2019-11-10 | Disposition: A | Discharge status: Home / Self Care | Payer: 59 | Attending: Emergency Medicine | Admitting: Emergency Medicine

## 2019-11-10 ENCOUNTER — Other Ambulatory Visit: Payer: Self-pay

## 2019-11-10 DIAGNOSIS — L03012 Cellulitis of left finger: Secondary | ICD-10-CM

## 2019-11-10 DIAGNOSIS — Z87891 Personal history of nicotine dependence: Secondary | ICD-10-CM | POA: Diagnosis not present

## 2019-11-10 DIAGNOSIS — M79645 Pain in left finger(s): Secondary | ICD-10-CM | POA: Diagnosis present

## 2019-11-10 NOTE — ED Provider Notes (Signed)
[facility] EMERGENCY DEPARTMENT Provider Note   CSN: [id number] Arrival date & time: 11/10/19  1116     History Chief Complaint  Patient presents with   Hand Pain    Brandon [name] is a 44 y.o. male presents to the ED for evaluation of pain in the left ring finger on the side of the nailbed.  Onset about a week.  Associated with some yellow discoloration to skin, swelling.  No interventions.  No other associate symptoms.  States it started with a hangnail.  HPI     Past Medical History:  Diagnosis Date   Anxiety    History of PSVT (paroxysmal supraventricular tachycardia)    Lesion of vocal cord    Palpitation    Right knee pain     Patient Active Problem List   Diagnosis Date Noted   Coccyx pain 07/02/2019   Laryngeal polyp 04/18/2019   Change in bowel habits 04/15/2019   Medication side effect 04/15/2019   Rash and nonspecific skin eruption 01/20/2019   Tobacco use 01/20/2019   Anxiety associated with depression 12/24/2018   Snores 12/24/2018   Elevated LDL cholesterol level 12/24/2018   Hospital discharge follow-up 12/24/2018   TIA (transient ischemic attack) 12/09/2018   Left sided numbness 12/09/2018   Healthcare maintenance 07/10/2018   PSVT (paroxysmal supraventricular tachycardia) (HCC) 06/19/2018   Chest pain 01/28/2018   Right patella fracture 08/21/2017    Past Surgical History:  Procedure Laterality Date   MICROLARYNGOSCOPY Right 06/16/2019   Procedure: MICROLARYNGOSCOPY WITH EXCISION OF VOCAL CORD LESION;  Surgeon: [name], MD;  Location: [facility];  Service: ENT;  Laterality: Right;   PATELLA FRACTURE SURGERY         Family History  Problem Relation Age of Onset   Cancer Mother        ESOPHAGEAL   Diabetes Father    Heart Problems Father        CARDIOMEGALY   Healthy Brother    Ovarian cancer Maternal Grandmother    Colon cancer Cousin    Prostate cancer Neg Hx     Social  History   Tobacco Use   Smoking status: Former Smoker    Packs/day: 0.50    Types: Cigarettes   Smokeless tobacco: Never Used   Tobacco comment: quit 04/2019  Vaping Use   Vaping Use: Never used  Substance Use Topics   Alcohol use: Yes    Comment: rare   Drug use: Yes    Types: Marijuana    Comment: rare    Home Medications Prior to Admission medications   Medication Sig Start Date End Date Taking? Authorizing Provider  atorvastatin (LIPITOR) 20 MG tablet Take 1 tablet (20 mg total) by mouth daily. 12/24/18   Mliss Sax, MD  buPROPion (WELLBUTRIN XL) 150 MG 24 hr tablet Take one in the morning and second dose mid afternoon. 04/18/19   Mliss Sax, MD    Allergies    Patient has no known allergies.  Review of Systems   Review of Systems  Skin: Positive for color change.       Nailbed swelling and pain  All other systems reviewed and are negative.   Physical Exam Updated Vital Signs BP 127/88 (BP Location: Right Arm)    Pulse (!) 55    Temp 97.9 F (36.6 C) (Oral)    Resp 18    Ht 6' (1.829 m)    Wt 83.1 kg    SpO2 100%  BMI 24.85 kg/m   Physical Exam Constitutional:      Appearance: He is well-developed.  HENT:     Head: Normocephalic.     Nose: Nose normal.  Eyes:     General: Lids are normal.  Cardiovascular:     Rate and Rhythm: Normal rate.  Pulmonary:     Effort: Pulmonary effort is normal. No respiratory distress.  Musculoskeletal:        General: Normal range of motion.     Cervical back: Normal range of motion.  Skin:    Comments: Mild edema, erythema and fluctuant area on lateral nail fold of the left ring finger consistent with paronychia.  No other expanding signs of cellulitis or involvement of the proximal joint.  Neurological:     Mental Status: He is alert.  Psychiatric:        Behavior: Behavior normal.     ED Results / Procedures / Treatments   Labs (all labs ordered are listed, but only abnormal results  are displayed) Labs Reviewed - No data to display  EKG None  Radiology No results found.  Procedures .Marland KitchenIncision and Drainage  Date/Time: 11/10/2019 1:53 PM Performed by: Liberty Handy, PA-C Authorized by: Liberty Handy, PA-C   Consent:    Consent obtained:  Verbal   Consent given by:  Patient   Risks discussed:  Bleeding, incomplete drainage, pain, infection and damage to other organs   Alternatives discussed:  Alternative treatment and no treatment Universal protocol:    Procedure explained and questions answered to patient or proxy's satisfaction: yes     Required blood products, implants, devices, and special equipment available: yes     Immediately prior to procedure a time out was called: yes     Patient identity confirmed:  Arm band Location:    Indications for incision and drainage: Paronychia.   Location:  Upper extremity   Upper extremity location:  Finger   Finger location:  L ring finger Pre-procedure details:    Skin preparation:  Chloraprep Anesthesia (see MAR for exact dosages):    Anesthesia method:  None Procedure type:    Complexity:  Simple Procedure details:    Needle aspiration: no     Incision types:  Single straight   Scalpel blade:  11   Wound management:  Irrigated with saline   Drainage:  Purulent   Drainage amount:  Moderate   Wound treatment:  Wound left open   Packing materials:  None Post-procedure details:    Patient tolerance of procedure:  Tolerated well, no immediate complications   (including critical care time)  Medications Ordered in ED Medications - No data to display  ED Course  I have reviewed the triage vital signs and the nursing notes.  Pertinent labs & imaging results that were available during my care of the patient were reviewed by me and considered in my medical decision making (see chart for details).    MDM Rules/Calculators/A&P                          Exam consistent with paronychia.  No signs of  severe infection, cellulitis or involvement of the joint.  Bedside incision and drainage without local anesthesia tolerated well without immediate complications.  Improvement in swelling.  Recommended warm compresses, NSAIDs.  No indication for antibiotics given localized infection and no systemic symptoms or severe infection.  Return precautions discussed.  He is comfortable with this plan. Final Clinical  Impression(s) / ED Diagnoses Final diagnoses:  Paronychia of left ring finger    Rx / DC Orders ED Discharge Orders    None       Jerrell Mylar 11/10/19 1355    Benjiman Core, MD 11/10/19 [phone]

## 2019-11-10 NOTE — ED Triage Notes (Signed)
Pt c/o pain/swelling to left ring finger-feels is "infected fingernail"-NAD-steady gait

## 2019-11-10 NOTE — Discharge Instructions (Addendum)
You were seen in the ED for abscess of your left ring finger nail fold  This was drained and soaked in the ED  I recommend massage and "milking" any more pus or blood under hot water at least 2-3 times a day.  Can take ibuprofen or acetaminophen as needed for pain  Pain and swelling should improve in the next 48 to 72 hours.  Return to the ED for worsening or return of swelling, redness, warmth or fever

## 2020-01-27 ENCOUNTER — Ambulatory Visit (INDEPENDENT_AMBULATORY_CARE_PROVIDER_SITE_OTHER): Payer: 59 | Admitting: Family Medicine

## 2020-01-27 ENCOUNTER — Ambulatory Visit: Payer: Self-pay

## 2020-01-27 ENCOUNTER — Other Ambulatory Visit: Payer: Self-pay

## 2020-01-27 ENCOUNTER — Encounter: Payer: Self-pay | Admitting: Family Medicine

## 2020-01-27 VITALS — BP 139/87 | HR 65 | Ht 72.0 in | Wt 185.0 lb

## 2020-01-27 DIAGNOSIS — M76899 Other specified enthesopathies of unspecified lower limb, excluding foot: Secondary | ICD-10-CM | POA: Diagnosis not present

## 2020-01-27 DIAGNOSIS — M25561 Pain in right knee: Secondary | ICD-10-CM

## 2020-01-27 NOTE — Assessment & Plan Note (Signed)
Mild changes on ultrasound to suggest tendinitis.  Likely exacerbated with his practice for inline skating. -Counseled on home exercise therapy and supportive care. -Counseled on ibuprofen. -Could consider physical therapy.

## 2020-01-27 NOTE — Progress Notes (Signed)
Brandon [name] - 44 y.o. male MRN [id number]  Date of birth: December 14, 1975  SUBJECTIVE:  Including CC & ROS.  Chief Complaint  Patient presents with  . Knee Pain    right    Brandon [name] is a 44 y.o. male that is presenting with right knee pain.  He has been inline skating and has noticed anterior knee pain.  Seems to be worse after practice.  No injury or inciting event.  Has not taken anything for the pain.   Review of Systems See HPI   HISTORY: Past Medical, Surgical, Social, and Family History Reviewed & Updated per EMR.   Pertinent Historical Findings include:  Past Medical History:  Diagnosis Date  . Anxiety   . History of PSVT (paroxysmal supraventricular tachycardia)   . Lesion of vocal cord   . Palpitation   . Right knee pain     Past Surgical History:  Procedure Laterality Date  . MICROLARYNGOSCOPY Right 06/16/2019   Procedure: MICROLARYNGOSCOPY WITH EXCISION OF VOCAL CORD LESION;  Surgeon: [name], MD;  Location: [facility];  Service: ENT;  Laterality: Right;  . PATELLA FRACTURE SURGERY      Family History  Problem Relation Age of Onset  . Cancer Mother        ESOPHAGEAL  . Diabetes Father   . Heart Problems Father        CARDIOMEGALY  . Healthy Brother   . Ovarian cancer Maternal Grandmother   . Colon cancer Cousin   . Prostate cancer Neg Hx     Social History   Socioeconomic History  . Marital status: Married    Spouse name: Not on file  . Number of children: 2  . Years of education: Not on file  . Highest education level: Not on file  Occupational History  . Not on file  Tobacco Use  . Smoking status: Former Smoker    Packs/day: 0.50    Types: Cigarettes  . Smokeless tobacco: Never Used  . Tobacco comment: quit 04/2019  Vaping Use  . Vaping Use: Never used  Substance and Sexual Activity  . Alcohol use: Yes    Comment: rare  . Drug use: Yes    Types: Marijuana    Comment: rare  . Sexual activity: Not on file  Other  Topics Concern  . Not on file  Social History Narrative  . Not on file   Social Determinants of Health   Financial Resource Strain:   . Difficulty of Paying Living Expenses: Not on file  Food Insecurity:   . Worried About Programme researcher, broadcasting/film/video in the Last Year: Not on file  . Ran Out of Food in the Last Year: Not on file  Transportation Needs:   . Lack of Transportation (Medical): Not on file  . Lack of Transportation (Non-Medical): Not on file  Physical Activity:   . Days of Exercise per Week: Not on file  . Minutes of Exercise per Session: Not on file  Stress:   . Feeling of Stress : Not on file  Social Connections:   . Frequency of Communication with Friends and Family: Not on file  . Frequency of Social Gatherings with Friends and Family: Not on file  . Attends Religious Services: Not on file  . Active Member of Clubs or Organizations: Not on file  . Attends Banker Meetings: Not on file  . Marital Status: Not on file  Intimate Partner Violence:   . Fear of Current  or Ex-Partner: Not on file  . Emotionally Abused: Not on file  . Physically Abused: Not on file  . Sexually Abused: Not on file     PHYSICAL EXAM:  VS: BP 139/87   Pulse 65   Ht 6' (1.829 m)   Wt 185 lb (83.9 kg)   BMI 25.09 kg/m  Physical Exam Gen: NAD, alert, cooperative with exam, well-appearing MSK:  Right knee: Normal range of motion. No effusion. Normal strength resistance. No instability. Neurovascular intact  Limited ultrasound: Right knee:  Trace effusion. Increased hyperemia of the quadriceps tendon. Normal-appearing patellar tendon.   Summary: Findings suggestive of quadriceps tendinitis.  Ultrasound and interpretation by Clare Gandy, MD   ASSESSMENT & PLAN:   Quadriceps tendinitis Mild changes on ultrasound to suggest tendinitis.  Likely exacerbated with his practice for inline skating. -Counseled on home exercise therapy and supportive care. -Counseled on  ibuprofen. -Could consider physical therapy.

## 2020-01-27 NOTE — Patient Instructions (Signed)
Good to see you Please try the quad exercises on non practice days  Please try the hip abduction exercises on practice days  Please use ibuprofen as needed  Please try ice   Please send me a message in MyChart with any questions or updates.  Please see me back in 4 weeks or as needed.   --Dr. Jordan Likes  Dr. Carmelia Roller or Esperanza Richters are across the hall that you could check out.

## 2020-02-08 ENCOUNTER — Emergency Department (HOSPITAL_BASED_OUTPATIENT_CLINIC_OR_DEPARTMENT_OTHER): Payer: 59

## 2020-02-08 ENCOUNTER — Encounter (HOSPITAL_BASED_OUTPATIENT_CLINIC_OR_DEPARTMENT_OTHER): Payer: Self-pay | Admitting: Emergency Medicine

## 2020-02-08 ENCOUNTER — Other Ambulatory Visit: Payer: Self-pay

## 2020-02-08 DIAGNOSIS — S43101A Unspecified dislocation of right acromioclavicular joint, initial encounter: Secondary | ICD-10-CM | POA: Insufficient documentation

## 2020-02-08 DIAGNOSIS — Y9289 Other specified places as the place of occurrence of the external cause: Secondary | ICD-10-CM | POA: Insufficient documentation

## 2020-02-08 DIAGNOSIS — Z87891 Personal history of nicotine dependence: Secondary | ICD-10-CM | POA: Insufficient documentation

## 2020-02-08 DIAGNOSIS — Y9389 Activity, other specified: Secondary | ICD-10-CM | POA: Insufficient documentation

## 2020-02-08 NOTE — ED Triage Notes (Signed)
Pt crashed while speed skating today in [place]. He states he hit a wall with his R shoulder. States he was seen today at another facility and told he has a separation injury but wants a second opinion. Pt in a sling provided by other facility.

## 2020-02-09 ENCOUNTER — Emergency Department (HOSPITAL_BASED_OUTPATIENT_CLINIC_OR_DEPARTMENT_OTHER)
Admission: EM | Admit: 2020-02-09 | Discharge: 2020-02-09 | Disposition: A | Discharge status: Home / Self Care | Payer: 59 | Attending: Emergency Medicine | Admitting: Emergency Medicine

## 2020-02-09 DIAGNOSIS — S43101A Unspecified dislocation of right acromioclavicular joint, initial encounter: Secondary | ICD-10-CM

## 2020-02-09 MED ORDER — IBUPROFEN 600 MG PO TABS: 600.0000 mg | ORAL_TABLET | Freq: Four times a day (QID) | ORAL | 0 refills | Status: DC | PRN

## 2020-02-09 MED ORDER — KETOROLAC TROMETHAMINE 15 MG/ML IJ SOLN
15.0000 mg | Freq: Once | INTRAMUSCULAR | Status: AC
  Administered 2020-02-09: 15 mg via INTRAMUSCULAR
  Filled 2020-02-09: qty 1

## 2020-02-09 NOTE — Discharge Instructions (Addendum)
You were seen today and appear to have a type III AC joint separation.  Often these can be managed with rest, ice, immobilization for 2 to 3 weeks.  Follow-up with sports medicine who can further evaluate and prognosticate.  If you need orthopedic follow-up, this was provided as well with Dr. Charlann Boxer.

## 2020-02-09 NOTE — ED Provider Notes (Signed)
[facility] EMERGENCY DEPARTMENT Provider Note   CSN: [id number] Arrival date & time: 02/08/20  1958     History Chief Complaint  Patient presents with  . Shoulder Injury    Brandon [name] is a 44 y.o. male.  HPI     This is a 44 year old male presents with a right shoulder injury.  He was speed skating earlier yesterday when he crashed into a wall.  He hit his right shoulder directly in the wall.  He was seen and evaluated at outside hospital and told he had a shoulder separation.  He wanted a second opinion.  He is left-handed.  He denies numbness or tingling in the arm or hand.  He rates his pain at 7 out of 10 in the right shoulder over the anterior portion of the shoulder.  He has not taken anything for his pain.  Denies other injury  Past Medical History:  Diagnosis Date  . Anxiety   . History of PSVT (paroxysmal supraventricular tachycardia)   . Lesion of vocal cord   . Palpitation   . Right knee pain     Patient Active Problem List   Diagnosis Date Noted  . Quadriceps tendinitis 01/27/2020  . Coccyx pain 07/02/2019  . Laryngeal polyp 04/18/2019  . Change in bowel habits 04/15/2019  . Medication side effect 04/15/2019  . Rash and nonspecific skin eruption 01/20/2019  . Tobacco use 01/20/2019  . Anxiety associated with depression 12/24/2018  . Snores 12/24/2018  . Elevated LDL cholesterol level 12/24/2018  . Hospital discharge follow-up 12/24/2018  . TIA (transient ischemic attack) 12/09/2018  . Left sided numbness 12/09/2018  . Healthcare maintenance 07/10/2018  . PSVT (paroxysmal supraventricular tachycardia) (HCC) 06/19/2018  . Chest pain 01/28/2018  . Right patella fracture 08/21/2017    Past Surgical History:  Procedure Laterality Date  . MICROLARYNGOSCOPY Right 06/16/2019   Procedure: MICROLARYNGOSCOPY WITH EXCISION OF VOCAL CORD LESION;  Surgeon: [name], MD;  Location: [facility];  Service: ENT;  Laterality: Right;  .  PATELLA FRACTURE SURGERY         Family History  Problem Relation Age of Onset  . Cancer Mother        ESOPHAGEAL  . Diabetes Father   . Heart Problems Father        CARDIOMEGALY  . Healthy Brother   . Ovarian cancer Maternal Grandmother   . Colon cancer Cousin   . Prostate cancer Neg Hx     Social History   Tobacco Use  . Smoking status: Former Smoker    Packs/day: 0.50    Types: Cigarettes  . Smokeless tobacco: Never Used  . Tobacco comment: quit 04/2019  Vaping Use  . Vaping Use: Never used  Substance Use Topics  . Alcohol use: Yes    Comment: rare  . Drug use: Yes    Types: Marijuana    Comment: rare    Home Medications Prior to Admission medications   Medication Sig Start Date End Date Taking? Authorizing Provider  atorvastatin (LIPITOR) 20 MG tablet Take 1 tablet (20 mg total) by mouth daily. 12/24/18   Mliss Sax, MD  buPROPion (WELLBUTRIN XL) 150 MG 24 hr tablet Take one in the morning and second dose mid afternoon. 04/18/19   Mliss Sax, MD  ibuprofen (ADVIL) 600 MG tablet Take 1 tablet (600 mg total) by mouth every 6 (six) hours as needed. 02/09/20   Brittin Belnap, Mayer Masker, MD    Allergies  Patient has no known allergies.  Review of Systems   Review of Systems  Musculoskeletal:       Right shoulder pain  Neurological: Negative for weakness and numbness.  All other systems reviewed and are negative.   Physical Exam Updated Vital Signs BP 122/83 (BP Location: Right Arm)   Pulse 87   Temp 98.9 F (37.2 C) (Oral)   Resp 17   Ht 1.829 m (6')   Wt 83.9 kg   SpO2 98%   BMI 25.09 kg/m   Physical Exam Vitals and nursing note reviewed.  Constitutional:      Appearance: He is well-developed.  HENT:     Head: Normocephalic and atraumatic.     Nose: Nose normal.     Mouth/Throat:     Mouth: Mucous membranes are moist.  Eyes:     Pupils: Pupils are equal, round, and reactive to light.  Cardiovascular:     Rate and Rhythm:  Normal rate and regular rhythm.  Pulmonary:     Effort: Pulmonary effort is normal. No respiratory distress.  Musculoskeletal:     Comments: Tenderness to palpation the right AC joint, slight swelling noted, no clavicular deformity noted, limited range of motion secondary to pain, patient is already in a sling as well, good grip strength, 2+ radial pulse distally  Skin:    General: Skin is warm and dry.  Neurological:     Mental Status: He is alert and oriented to person, place, and time.  Psychiatric:        Mood and Affect: Mood normal.     ED Results / Procedures / Treatments   Labs (all labs ordered are listed, but only abnormal results are displayed) Labs Reviewed - No data to display  EKG None  Radiology DG Shoulder Right  Result Date: 02/08/2020 CLINICAL DATA:  Status post trauma. EXAM: RIGHT SHOULDER - 2+ VIEW COMPARISON:  None. FINDINGS: There is no evidence of acute fracture. Approximately 1.6 cm superior displacement of the distal clavicle is seen with respect to the right acromion. Soft tissues are unremarkable. IMPRESSION: Grade 3 right acromioclavicular joint injury. Electronically Signed   By: Aram Candela M.D.   On: 02/08/2020 21:58    Procedures Procedures (including critical care time)  Medications Ordered in ED Medications  ketorolac (TORADOL) 15 MG/ML injection 15 mg (15 mg Intramuscular Given 02/09/20 0119)    ED Course  I have reviewed the triage vital signs and the nursing notes.  Pertinent labs & imaging results that were available during my care of the patient were reviewed by me and considered in my medical decision making (see chart for details).    MDM Rules/Calculators/A&P                           Patient presents with shoulder injury.  Previously told he had an AC joint separation.  X-rays here confirm the same.  Clinically this is consistent with his exam.  He is neurovascularly intact.  Recommend ice, immobilization, anti-inflammatory  medications.  He has seen Dr. Jordan Likes with sports medicine multiple times in the past.  Feel this is an appropriate immediate follow-up.  He will also be given orthopedic follow-up to pursue other treatment if conservative management fails.  After history, exam, and medical workup I feel the patient has been appropriately medically screened and is safe for discharge home. Pertinent diagnoses were discussed with the patient. Patient was given return precautions.   Final  Clinical Impression(s) / ED Diagnoses Final diagnoses:  Separation of right acromioclavicular joint, initial encounter    Rx / DC Orders ED Discharge Orders         Ordered    ibuprofen (ADVIL) 600 MG tablet  Every 6 hours PRN        02/09/20 0116           Shon Baton, MD 02/09/20 [phone]

## 2020-02-10 ENCOUNTER — Ambulatory Visit (INDEPENDENT_AMBULATORY_CARE_PROVIDER_SITE_OTHER): Payer: 59 | Admitting: Family Medicine

## 2020-02-10 ENCOUNTER — Encounter: Payer: Self-pay | Admitting: Family Medicine

## 2020-02-10 ENCOUNTER — Other Ambulatory Visit: Payer: Self-pay

## 2020-02-10 VITALS — BP 122/82 | HR 64 | Ht 72.0 in | Wt 185.0 lb

## 2020-02-10 DIAGNOSIS — S43101A Unspecified dislocation of right acromioclavicular joint, initial encounter: Secondary | ICD-10-CM

## 2020-02-10 DIAGNOSIS — S43101D Unspecified dislocation of right acromioclavicular joint, subsequent encounter: Secondary | ICD-10-CM | POA: Insufficient documentation

## 2020-02-10 NOTE — Assessment & Plan Note (Signed)
Injury occurred on 10/2 while he was skiing skating. -Counseled supportive care. -Continue sling. -Counseled on conservative versus surgical treatment.  Will refer to orthopedics to get their input.

## 2020-02-10 NOTE — Patient Instructions (Signed)
Good to see you Please continue the sling  Please try ice  Please use ibuprofen as needed   Please send me a message in MyChart with any questions or updates.  Please see [place] back as needed.   --Dr. Jordan Likes

## 2020-02-10 NOTE — Progress Notes (Signed)
Jeronimo Hellberg - 44 y.o. male MRN [id number]  Date of birth: May 11, 1975  SUBJECTIVE:  Including CC & ROS.  Chief Complaint  Patient presents with  . Shoulder Injury    right    Gryffin Altice is a 44 y.o. male that is presenting with right shoulder pain.  He was being skating this past weekend had an event.  He had a fall and hit his right shoulder on the barrier wall.  He was evaluated emergency department and diagnosed with a shoulder separation.  He was placed in a sling.  His pain is fairly well controlled..  Independent review of the right shoulder x-ray from 10/9 shows a grade 3 AC joint separation.   Review of Systems See HPI   HISTORY: Past Medical, Surgical, Social, and Family History Reviewed & Updated per EMR.   Pertinent Historical Findings include:  Past Medical History:  Diagnosis Date  . Anxiety   . History of PSVT (paroxysmal supraventricular tachycardia)   . Lesion of vocal cord   . Palpitation   . Right knee pain     Past Surgical History:  Procedure Laterality Date  . MICROLARYNGOSCOPY Right 06/16/2019   Procedure: MICROLARYNGOSCOPY WITH EXCISION OF VOCAL CORD LESION;  Surgeon: [name], MD;  Location: [facility];  Service: ENT;  Laterality: Right;  . PATELLA FRACTURE SURGERY      Family History  Problem Relation Age of Onset  . Cancer Mother        ESOPHAGEAL  . Diabetes Father   . Heart Problems Father        CARDIOMEGALY  . Healthy Brother   . Ovarian cancer Maternal Grandmother   . Colon cancer Cousin   . Prostate cancer Neg Hx     Social History   Socioeconomic History  . Marital status: Married    Spouse name: Not on file  . Number of children: 2  . Years of education: Not on file  . Highest education level: Not on file  Occupational History  . Not on file  Tobacco Use  . Smoking status: Former Smoker    Packs/day: 0.50    Types: Cigarettes  . Smokeless tobacco: Never Used  . Tobacco comment: quit 04/2019    Vaping Use  . Vaping Use: Never used  Substance and Sexual Activity  . Alcohol use: Yes    Comment: rare  . Drug use: Yes    Types: Marijuana    Comment: rare  . Sexual activity: Not on file  Other Topics Concern  . Not on file  Social History Narrative  . Not on file   Social Determinants of Health   Financial Resource Strain:   . Difficulty of Paying Living Expenses: Not on file  Food Insecurity:   . Worried About Programme researcher, broadcasting/film/video in the Last Year: Not on file  . Ran Out of Food in the Last Year: Not on file  Transportation Needs:   . Lack of Transportation (Medical): Not on file  . Lack of Transportation (Non-Medical): Not on file  Physical Activity:   . Days of Exercise per Week: Not on file  . Minutes of Exercise per Session: Not on file  Stress:   . Feeling of Stress : Not on file  Social Connections:   . Frequency of Communication with Friends and Family: Not on file  . Frequency of Social Gatherings with Friends and Family: Not on file  . Attends Religious Services: Not on file  .  Active Member of Clubs or Organizations: Not on file  . Attends Banker Meetings: Not on file  . Marital Status: Not on file  Intimate Partner Violence:   . Fear of Current or Ex-Partner: Not on file  . Emotionally Abused: Not on file  . Physically Abused: Not on file  . Sexually Abused: Not on file     PHYSICAL EXAM:  VS: BP 122/82   Pulse 64   Ht 6' (1.829 m)   Wt 185 lb (83.9 kg)   BMI 25.09 kg/m  Physical Exam Gen: NAD, alert, cooperative with exam, well-appearing MSK:  Right shoulder: Deformity present. Tenderness over the [facility] joint. Neurovascular intact     ASSESSMENT & PLAN:   Acromioclavicular joint separation, type 3, right, initial encounter Injury occurred on 10/2 while he was skiing skating. -Counseled supportive care. -Continue sling. -Counseled on conservative versus surgical treatment.  Will refer to orthopedics to get their  input.

## 2020-02-26 ENCOUNTER — Ambulatory Visit: Payer: 59 | Admitting: Family Medicine

## 2020-03-11 ENCOUNTER — Ambulatory Visit: Payer: Self-pay | Admitting: Family Medicine

## 2020-03-11 NOTE — Progress Notes (Deleted)
Brandon [name] - 44 y.o. male MRN [id number]  Date of birth: January 21, 1976  SUBJECTIVE:  Including CC & ROS.  No chief complaint on file.   Brandon [name] is a 44 y.o. male that is  ***.  ***   Review of Systems See HPI   HISTORY: Past Medical, Surgical, Social, and Family History Reviewed & Updated per EMR.   Pertinent Historical Findings include:  Past Medical History:  Diagnosis Date  . Anxiety   . History of PSVT (paroxysmal supraventricular tachycardia)   . Lesion of vocal cord   . Palpitation   . Right knee pain     Past Surgical History:  Procedure Laterality Date  . MICROLARYNGOSCOPY Right 06/16/2019   Procedure: MICROLARYNGOSCOPY WITH EXCISION OF VOCAL CORD LESION;  Surgeon: [name], MD;  Location: [facility];  Service: ENT;  Laterality: Right;  . PATELLA FRACTURE SURGERY      Family History  Problem Relation Age of Onset  . Cancer Mother        ESOPHAGEAL  . Diabetes Father   . Heart Problems Father        CARDIOMEGALY  . Healthy Brother   . Ovarian cancer Maternal Grandmother   . Colon cancer Cousin   . Prostate cancer Neg Hx     Social History   Socioeconomic History  . Marital status: Married    Spouse name: Not on file  . Number of children: 2  . Years of education: Not on file  . Highest education level: Not on file  Occupational History  . Not on file  Tobacco Use  . Smoking status: Former Smoker    Packs/day: 0.50    Types: Cigarettes  . Smokeless tobacco: Never Used  . Tobacco comment: quit 04/2019  Vaping Use  . Vaping Use: Never used  Substance and Sexual Activity  . Alcohol use: Yes    Comment: rare  . Drug use: Yes    Types: Marijuana    Comment: rare  . Sexual activity: Not on file  Other Topics Concern  . Not on file  Social History Narrative  . Not on file   Social Determinants of Health   Financial Resource Strain:   . Difficulty of Paying Living Expenses: Not on file  Food Insecurity:   .  Worried About Programme researcher, broadcasting/film/video in the Last Year: Not on file  . Ran Out of Food in the Last Year: Not on file  Transportation Needs:   . Lack of Transportation (Medical): Not on file  . Lack of Transportation (Non-Medical): Not on file  Physical Activity:   . Days of Exercise per Week: Not on file  . Minutes of Exercise per Session: Not on file  Stress:   . Feeling of Stress : Not on file  Social Connections:   . Frequency of Communication with Friends and Family: Not on file  . Frequency of Social Gatherings with Friends and Family: Not on file  . Attends Religious Services: Not on file  . Active Member of Clubs or Organizations: Not on file  . Attends Banker Meetings: Not on file  . Marital Status: Not on file  Intimate Partner Violence:   . Fear of Current or Ex-Partner: Not on file  . Emotionally Abused: Not on file  . Physically Abused: Not on file  . Sexually Abused: Not on file     PHYSICAL EXAM:  VS: There were no vitals taken for this visit. Physical Exam  Gen: NAD, alert, cooperative with exam, well-appearing MSK:  ***      ASSESSMENT & PLAN:   No problem-specific Assessment & Plan notes found for this encounter.

## 2020-03-24 ENCOUNTER — Other Ambulatory Visit: Payer: Self-pay

## 2020-03-24 ENCOUNTER — Emergency Department (HOSPITAL_BASED_OUTPATIENT_CLINIC_OR_DEPARTMENT_OTHER)
Admission: EM | Admit: 2020-03-24 | Discharge: 2020-03-24 | Disposition: A | Discharge status: Home / Self Care | Payer: BC Managed Care – PPO | Attending: Emergency Medicine | Admitting: Emergency Medicine

## 2020-03-24 ENCOUNTER — Emergency Department (HOSPITAL_BASED_OUTPATIENT_CLINIC_OR_DEPARTMENT_OTHER): Payer: BC Managed Care – PPO

## 2020-03-24 ENCOUNTER — Encounter (HOSPITAL_BASED_OUTPATIENT_CLINIC_OR_DEPARTMENT_OTHER): Payer: Self-pay | Admitting: *Deleted

## 2020-03-24 DIAGNOSIS — Z8673 Personal history of transient ischemic attack (TIA), and cerebral infarction without residual deficits: Secondary | ICD-10-CM | POA: Insufficient documentation

## 2020-03-24 DIAGNOSIS — R0781 Pleurodynia: Secondary | ICD-10-CM

## 2020-03-24 DIAGNOSIS — X58XXXA Exposure to other specified factors, initial encounter: Secondary | ICD-10-CM | POA: Diagnosis not present

## 2020-03-24 DIAGNOSIS — Z87891 Personal history of nicotine dependence: Secondary | ICD-10-CM | POA: Insufficient documentation

## 2020-03-24 DIAGNOSIS — S299XXA Unspecified injury of thorax, initial encounter: Secondary | ICD-10-CM

## 2020-03-24 MED ORDER — DICLOFENAC SODIUM 1 % EX GEL: 2.0000 g | Freq: Four times a day (QID) | CUTANEOUS | 0 refills | Status: DC

## 2020-03-24 NOTE — ED Triage Notes (Signed)
Pt c/t rib injury during a chiropractic visit x 17 days ago

## 2020-03-24 NOTE — Discharge Instructions (Signed)
Your xray did not show any signs of rib fractures or other abnormalities today I have provided a prescription for voltaren gel to use for pain as needed  Follow up with your PCP regarding your ED visit today  Return to the ED for any worsening symptoms including worsening pain, shortness of breath, severe chest pain, nausea, vomiting, coughing up blood, passing out, or any other new/concerning symptoms.

## 2020-03-24 NOTE — ED Notes (Signed)
ED Provider at bedside. 

## 2020-03-24 NOTE — ED Provider Notes (Signed)
[facility] EMERGENCY DEPARTMENT Provider Note   CSN: [id number] Arrival date & time: 03/24/20  1433     History Chief Complaint  Patient presents with  . Rib Injury    Brandon [name] is a 44 y.o. male who presents to the ED today with complaint of sudden onset, constant, sharp, right sided rib pain that began on 11/01. Pt reports he recently separated his R shoulder during a speed skating accident in October; he had surgery on it and was doing well however his speed skating coach recommended he go to a chiropractor to get "realigned." Pt states he went and was laying face down on the table when the chiropractor pushed hard on his upper back and he felt a sharp pain in his right rib. He mentioned it during the realignment however states he was brushed off. He is here today to see if his rib is broken - he states history of bruised ribs in the past but states this feels different. He has some pain with deep inspiration but otherwise denies SOB. He has since been cleared to go back to speed skating and did this a few days ago without issue; no chest pain or SOB. He has not been taking anything for the pain as he does not like taking pills. No other complaints at this time.   The history is provided by the patient and medical records.       Past Medical History:  Diagnosis Date  . Anxiety   . History of PSVT (paroxysmal supraventricular tachycardia)   . Lesion of vocal cord   . Palpitation   . Right knee pain     Patient Active Problem List   Diagnosis Date Noted  . Acromioclavicular joint separation, type 3, right, initial encounter 02/10/2020  . Quadriceps tendinitis 01/27/2020  . Coccyx pain 07/02/2019  . Laryngeal polyp 04/18/2019  . Change in bowel habits 04/15/2019  . Medication side effect 04/15/2019  . Rash and nonspecific skin eruption 01/20/2019  . Tobacco use 01/20/2019  . Anxiety associated with depression 12/24/2018  . Snores 12/24/2018  . Elevated LDL  cholesterol level 12/24/2018  . Hospital discharge follow-up 12/24/2018  . TIA (transient ischemic attack) 12/09/2018  . Left sided numbness 12/09/2018  . Healthcare maintenance 07/10/2018  . PSVT (paroxysmal supraventricular tachycardia) (HCC) 06/19/2018  . Chest pain 01/28/2018  . Right patella fracture 08/21/2017    Past Surgical History:  Procedure Laterality Date  . MICROLARYNGOSCOPY Right 06/16/2019   Procedure: MICROLARYNGOSCOPY WITH EXCISION OF VOCAL CORD LESION;  Surgeon: [name], MD;  Location: [facility];  Service: ENT;  Laterality: Right;  . PATELLA FRACTURE SURGERY         Family History  Problem Relation Age of Onset  . Cancer Mother        ESOPHAGEAL  . Diabetes Father   . Heart Problems Father        CARDIOMEGALY  . Healthy Brother   . Ovarian cancer Maternal Grandmother   . Colon cancer Cousin   . Prostate cancer Neg Hx     Social History   Tobacco Use  . Smoking status: Former Smoker    Packs/day: 0.50    Types: Cigarettes  . Smokeless tobacco: Never Used  . Tobacco comment: quit 04/2019  Vaping Use  . Vaping Use: Never used  Substance Use Topics  . Alcohol use: Yes    Comment: rare  . Drug use: Yes    Types: Marijuana  Comment: rare    Home Medications Prior to Admission medications   Medication Sig Start Date End Date Taking? Authorizing Provider  atorvastatin (LIPITOR) 20 MG tablet Take 1 tablet (20 mg total) by mouth daily. 12/24/18   Mliss Sax, MD  buPROPion (WELLBUTRIN XL) 150 MG 24 hr tablet Take one in the morning and second dose mid afternoon. 04/18/19   Mliss Sax, MD  diclofenac Sodium (VOLTAREN) 1 % GEL Apply 2 g topically 4 (four) times daily. 03/24/20   Hyman Hopes, Adaja Wander, PA-C  ibuprofen (ADVIL) 600 MG tablet Take 1 tablet (600 mg total) by mouth every 6 (six) hours as needed. 02/09/20   Horton, Mayer Masker, MD    Allergies    Patient has no known allergies.  Review of Systems     Review of Systems  Constitutional: Negative for chills and fever.  Respiratory: Negative for cough and shortness of breath.   Cardiovascular: Negative for chest pain.       + right rib pain  Skin: Negative for color change.    Physical Exam Updated Vital Signs BP 120/79   Pulse 94   Temp 98.3 F (36.8 C) (Oral)   Resp 18   Ht 6' (1.829 m)   Wt 83.9 kg   SpO2 99%   BMI 25.09 kg/m   Physical Exam Vitals and nursing note reviewed.  Constitutional:      Appearance: He is not ill-appearing or diaphoretic.  HENT:     Head: Normocephalic and atraumatic.  Eyes:     Conjunctiva/sclera: Conjunctivae normal.  Cardiovascular:     Rate and Rhythm: Normal rate and regular rhythm.     Pulses: Normal pulses.  Pulmonary:     Effort: Pulmonary effort is normal. No respiratory distress.     Breath sounds: Normal breath sounds. No wheezing, rhonchi or rales.     Comments: Able to speak in full sentences without difficulty. Satting 99% on RA. LCTAB.  Chest:       Comments: No ecchymosis appreciated; no crepitus. + TTP along the 4th and 5th ribs as seen above.  Skin:    General: Skin is warm and dry.     Coloration: Skin is not jaundiced.  Neurological:     Mental Status: He is alert.     ED Results / Procedures / Treatments   Labs (all labs ordered are listed, but only abnormal results are displayed) Labs Reviewed - No data to display  EKG None  Radiology DG Ribs Unilateral W/Chest Right  Result Date: 03/24/2020 CLINICAL DATA:  Right rib pain, status post chiropractic manipulation EXAM: RIGHT RIBS AND CHEST - 3+ VIEW COMPARISON:  None. FINDINGS: No fracture or other bone lesions are seen involving the ribs. There is no evidence of pneumothorax or pleural effusion. Both lungs are clear. Heart size and mediastinal contours are within normal limits. IMPRESSION: No displaced rib fracture or other radiographic abnormality of the right ribs to explain pain. No acute abnormality of  the lungs. Electronically Signed   By: Lauralyn Primes M.D.   On: 03/24/2020 16:26    Procedures Procedures (including critical care time)  Medications Ordered in ED Medications - No data to display  ED Course  I have reviewed the triage vital signs and the nursing notes.  Pertinent labs & imaging results that were available during my care of the patient were reviewed by me and considered in my medical decision making (see chart for details).    MDM Rules/Calculators/A&P  44 year old male presenting to the ED today for evaluation of right sided rib pain s/2 seeing a chiropractor on 11/01. Reports he was pushing on his back when he felt a sharp pain to his right side. Pt is concerned his rib could be broken and wanted to come to have it evaluated to see if he needed legal action. On arrival to the ED pt is afebrile, nontachycardic, and nontachypneic. He appears to be in NAD. He is able to speak in full sentences without difficulty and satting 99% on RA. There is no obvious ecchymosis appreciated to the chest wall however pt does have TTP to the right 4th and 5th ribs; no crepitus. He does have some difficulty with deep inspiration s/2 pain however denies SOB. Has been able to exercise since without difficulty. Will plan for rib xray and EKG at this time. If negative will discharge pt home.   EKG with early repol. CXR negative for fracture at this time. Pain may be secondary to rib contusion. Will plan to discharge home with Rx voltaren gel for symptomatic relief and PCP follow up. Pt is in agreement with plan and stable for discharge home.   This note was prepared using Dragon voice recognition software and may include unintentional dictation errors due to the inherent limitations of voice recognition software.  Final Clinical Impression(s) / ED Diagnoses Final diagnoses:  Rib injury  Rib pain    Rx / DC Orders ED Discharge Orders         Ordered    diclofenac  Sodium (VOLTAREN) 1 % GEL  4 times daily        03/24/20 1645           Discharge Instructions     Your xray did not show any signs of rib fractures or other abnormalities today I have provided a prescription for voltaren gel to use for pain as needed  Follow up with your PCP regarding your ED visit today  Return to the ED for any worsening symptoms including worsening pain, shortness of breath, severe chest pain, nausea, vomiting, coughing up blood, passing out, or any other new/concerning symptoms.        Tanda Rockers, PA-C 03/24/20 1646    Milagros Loll, MD 03/26/20 [phone]

## 2020-03-24 NOTE — ED Notes (Signed)
Patient transported to X-ray will do ekg when he returns

## 2020-03-24 NOTE — ED Notes (Signed)
Pt at

## 2020-03-25 ENCOUNTER — Ambulatory Visit: Payer: Self-pay | Admitting: Family Medicine

## 2020-03-25 NOTE — Progress Notes (Deleted)
Brandon [name] - 44 y.o. male MRN [id number]  Date of birth: 08/21/1975  SUBJECTIVE:  Including CC & ROS.  No chief complaint on file.   Brandon [name] is a 44 y.o. male that is  ***.  ***   Review of Systems See HPI   HISTORY: Past Medical, Surgical, Social, and Family History Reviewed & Updated per EMR.   Pertinent Historical Findings include:  Past Medical History:  Diagnosis Date  . Anxiety   . History of PSVT (paroxysmal supraventricular tachycardia)   . Lesion of vocal cord   . Palpitation   . Right knee pain     Past Surgical History:  Procedure Laterality Date  . MICROLARYNGOSCOPY Right 06/16/2019   Procedure: MICROLARYNGOSCOPY WITH EXCISION OF VOCAL CORD LESION;  Surgeon: Wolicki, Karol, MD;  Location: [facility];  Service: ENT;  Laterality: Right;  . PATELLA FRACTURE SURGERY      Family History  Problem Relation Age of Onset  . Cancer Mother        ESOPHAGEAL  . Diabetes Father   . Heart Problems Father        CARDIOMEGALY  . Healthy Brother   . Ovarian cancer Maternal Grandmother   . Colon cancer Cousin   . Prostate cancer Neg Hx     Social History   Socioeconomic History  . Marital status: Married    Spouse name: Not on file  . Number of children: 2  . Years of education: Not on file  . Highest education level: Not on file  Occupational History  . Not on file  Tobacco Use  . Smoking status: Former Smoker    Packs/day: 0.50    Types: Cigarettes  . Smokeless tobacco: Never Used  . Tobacco comment: quit 04/2019  Vaping Use  . Vaping Use: Never used  Substance and Sexual Activity  . Alcohol use: Yes    Comment: rare  . Drug use: Yes    Types: Marijuana    Comment: rare  . Sexual activity: Not on file  Other Topics Concern  . Not on file  Social History Narrative  . Not on file   Social Determinants of Health   Financial Resource Strain:   . Difficulty of Paying Living Expenses: Not on file  Food Insecurity:   .  Worried About Running Out of Food in the Last Year: Not on file  . Ran Out of Food in the Last Year: Not on file  Transportation Needs:   . Lack of Transportation (Medical): Not on file  . Lack of Transportation (Non-Medical): Not on file  Physical Activity:   . Days of Exercise per Week: Not on file  . Minutes of Exercise per Session: Not on file  Stress:   . Feeling of Stress : Not on file  Social Connections:   . Frequency of Communication with Friends and Family: Not on file  . Frequency of Social Gatherings with Friends and Family: Not on file  . Attends Religious Services: Not on file  . Active Member of Clubs or Organizations: Not on file  . Attends Club or Organization Meetings: Not on file  . Marital Status: Not on file  Intimate Partner Violence:   . Fear of Current or Ex-Partner: Not on file  . Emotionally Abused: Not on file  . Physically Abused: Not on file  . Sexually Abused: Not on file     PHYSICAL EXAM:  VS: There were no vitals taken for this visit. Physical Exam   Gen: NAD, alert, cooperative with exam, well-appearing MSK:  ***      ASSESSMENT & PLAN:   No problem-specific Assessment & Plan notes found for this encounter.

## 2020-04-06 ENCOUNTER — Other Ambulatory Visit: Payer: Self-pay

## 2020-04-07 ENCOUNTER — Encounter: Payer: Self-pay | Admitting: Nurse Practitioner

## 2020-04-07 ENCOUNTER — Ambulatory Visit: Payer: BC Managed Care – PPO | Admitting: Nurse Practitioner

## 2020-04-07 VITALS — BP 118/80 | HR 72 | Temp 98.0°F | Ht 72.0 in | Wt 188.6 lb

## 2020-04-07 DIAGNOSIS — B09 Unspecified viral infection characterized by skin and mucous membrane lesions: Secondary | ICD-10-CM | POA: Diagnosis not present

## 2020-04-07 MED ORDER — HYDROXYZINE HCL 25 MG PO TABS: 25.0000 mg | ORAL_TABLET | Freq: Three times a day (TID) | ORAL | 0 refills | Status: DC | PRN

## 2020-04-07 MED ORDER — BETAMETHASONE VALERATE 0.1 % EX OINT: 1.0000 "application " | TOPICAL_OINTMENT | Freq: Two times a day (BID) | CUTANEOUS | 2 refills | Status: AC

## 2020-04-07 MED ORDER — ACYCLOVIR 800 MG PO TABS: 800.0000 mg | ORAL_TABLET | Freq: Four times a day (QID) | ORAL | 0 refills | Status: AC

## 2020-04-07 NOTE — Progress Notes (Signed)
Subjective:  Patient ID: Brandon [name], male    DOB: 07/07/1975  Age: 44 y.o. MRN: [id number]  CC: Acute Visit (Pt c/o rash on skin x1 week. Pt states the rash is mainly on his arms and the sides of his belly. Denies itching, burning sensations, or pain)  Rash This is a new problem. The current episode started in the past 7 days. The problem is unchanged. The rash is diffuse. The rash is characterized by dryness and scaling. He was exposed to nothing. Pertinent negatives include no anorexia, congestion, cough, diarrhea, eye pain, facial edema, fatigue, fever, joint pain, rhinorrhea, shortness of breath or sore throat. Past treatments include nothing. There is no history of allergies or eczema.  first lesion appeared on right thigh. Denies any rash on hands and feet. No recent URI No new medication Smokes marijuana and use of CBD product daily, this is not new per patient.  Reviewed past Medical, Social and Family history today.  Outpatient Medications Prior to Visit  Medication Sig Dispense Refill  . atorvastatin (LIPITOR) 20 MG tablet Take 1 tablet (20 mg total) by mouth daily. (Patient not taking: Reported on 04/07/2020) 90 tablet 3  . buPROPion (WELLBUTRIN XL) 150 MG 24 hr tablet Take one in the morning and second dose mid afternoon. (Patient not taking: Reported on 04/07/2020) 60 tablet 1  . diclofenac Sodium (VOLTAREN) 1 % GEL Apply 2 g topically 4 (four) times daily. (Patient not taking: Reported on 04/07/2020) 50 g 0  . ibuprofen (ADVIL) 600 MG tablet Take 1 tablet (600 mg total) by mouth every 6 (six) hours as needed. (Patient not taking: Reported on 04/07/2020) 30 tablet 0   No facility-administered medications prior to visit.    ROS See HPI  Objective:  BP 118/80 (BP Location: Left Arm, Patient Position: Sitting, Cuff Size: Large)   Pulse 72   Temp 98 F (36.7 C) (Temporal)   Ht 6' (1.829 m)   Wt 188 lb 9.6 oz (85.5 kg)   SpO2 99%   BMI 25.58 kg/m   Physical  Exam Vitals reviewed.  Constitutional:      General: He is not in acute distress. Eyes:     Extraocular Movements: Extraocular movements intact.     Conjunctiva/sclera: Conjunctivae normal.  Cardiovascular:     Rate and Rhythm: Normal rate.     Pulses: Normal pulses.  Pulmonary:     Effort: Pulmonary effort is normal.  Musculoskeletal:     Cervical back: Normal range of motion and neck supple.  Lymphadenopathy:     Cervical: No cervical adenopathy.  Skin:    Findings: Rash present. Rash is macular and papular.          Comments: No palmar lesions Maculopapular lesions on torso, arms and upper legs  Neurological:     Mental Status: He is alert and oriented to person, place, and time.     Assessment & Plan:  This visit occurred during the SARS-CoV-2 public health emergency.  Safety protocols were in place, including screening questions prior to the visit, additional usage of staff PPE, and extensive cleaning of exam room while observing appropriate contact time as indicated for disinfecting solutions.   Kimmy was seen today for acute visit.  Diagnoses and all orders for this visit:  Viral exanthem -     acyclovir (ZOVIRAX) 800 MG tablet; Take 1 tablet (800 mg total) by mouth 4 (four) times daily for 7 days. -     betamethasone valerate ointment (VALISONE)  0.1 %; Apply 1 application topically 2 (two) times daily. -     hydrOXYzine (ATARAX/VISTARIL) 25 MG tablet; Take 1 tablet (25 mg total) by mouth 3 (three) times daily as needed for itching.   Problem List Items Addressed This Visit    None    Visit Diagnoses    Viral exanthem    -  Primary   Relevant Medications   acyclovir (ZOVIRAX) 800 MG tablet   betamethasone valerate ointment (VALISONE) 0.1 %   hydrOXYzine (ATARAX/VISTARIL) 25 MG tablet      Follow-up: No follow-ups on file.  Alysia Penna, NP

## 2020-04-07 NOTE — Patient Instructions (Signed)
Pityriasis Rosea Pityriasis rosea is a rash that usually appears on the chest, abdomen, and back. It may also appear on the upper arms and upper legs. It usually begins as a single patch, and then more patches start to develop. The rash may cause mild itching, but it normally does not cause other problems. It usually goes away without treatment. However, it may take weeks or months for the rash to go away completely. What are the causes? The cause of this condition is not known. The condition does not spread from person to person (is not contagious). What increases the risk? This condition is more likely to develop in:  Persons aged 10-35 years.  Pregnant women. It is more common in the spring and fall seasons. What are the signs or symptoms? The main symptom of this condition is a rash.  The rash usually begins with a single oval patch that is larger than the ones that follow. This is called a herald patch. It generally appears a week or more before the rest of the rash appears.  When more patches start to develop, they spread quickly on the chest, abdomen, back, arms, and legs. These patches are smaller than the first one.  The patches that make up the rash are usually oval-shaped and pink or red in color. They are usually flat but may sometimes be raised so that they can be felt with a finger. They may also be finely crinkled and have a scaly ring around the edge. Some people may have mild itching and nonspecific symptoms, such as:  Nausea.  Loss of appetite.  Difficulty concentrating.  Headache.  Irritability.  Sore throat.  Mild fever. How is this diagnosed? This condition may be diagnosed based on:  Your medical history and a physical exam.  Tests to rule out other causes. This may include blood tests or a test in which a small sample of skin is removed from the rash (biopsy) and checked in a lab. How is this treated?     Treatment is not usually needed for this  condition. The rash will often go away on its own in 4-8 weeks. In some cases, a health care provider may recommend or prescribe medicine to reduce itching. Follow these instructions at home:  Take or apply over-the-counter and prescription medicines only as told by your health care provider.  Avoid scratching the affected areas of skin.  Do not take hot baths or use a sauna. Use only warm water when bathing or showering. Heat can increase itching. Adding cornstarch to your bath may help to relieve the itching.  Avoid exposure to the sun and other sources of UV light, such as tanning beds, as told by your health care provider. UV light may help the rash go away but may cause unwanted changes in skin color.  Keep all follow-up visits as told by your health care provider. This is important. Contact a health care provider if:  Your rash does not go away in 8 weeks.  Your rash gets much worse.  You have a fever.  You have swelling or pain in the rash area.  You have fluid, blood, or pus coming from the rash area. Summary  Pityriasis rosea is a rash that usually appears on the trunk of the body. It can also appear on the upper arms and upper legs.  The rash usually begins with a single oval patch (herald patch) that appears a week or more before the rest of the rash appears.   The herald patch is larger than the ones that follow.  The rash may cause mild itching, but it usually does not cause other problems. It usually goes away without treatment in 4-8 weeks.  In some cases, a health care provider may recommend or prescribe medicine to reduce itching. This information is not intended to replace advice given to you by your health care provider. Make sure you discuss any questions you have with your health care provider. Document Revised: 04/23/2017 Document Reviewed: 04/23/2017 Elsevier Patient Education  2020 [employer] Inc[employer]  

## 2020-04-20 ENCOUNTER — Other Ambulatory Visit: Payer: Self-pay | Admitting: Nurse Practitioner

## 2020-04-20 DIAGNOSIS — B09 Unspecified viral infection characterized by skin and mucous membrane lesions: Secondary | ICD-10-CM

## 2020-04-20 NOTE — Telephone Encounter (Signed)
Pt called because he was seen on 04/07/20 for a rash and was prescribed medication for it and said the rash is still there, he said it might look like it has slightly faded. He is out of acyclovir and wondered what he should do next

## 2020-04-21 MED ORDER — HYDROXYZINE HCL 25 MG PO TABS: 25.0000 mg | ORAL_TABLET | Freq: Three times a day (TID) | ORAL | 0 refills | Status: AC | PRN

## 2020-05-12 ENCOUNTER — Telehealth: Payer: Self-pay | Admitting: Nurse Practitioner

## 2020-05-12 DIAGNOSIS — R21 Rash and other nonspecific skin eruption: Secondary | ICD-10-CM

## 2020-05-12 NOTE — Telephone Encounter (Signed)
Spoke with patient and he verbalized understanding. Pt states he does not have any acute issue to address and feels the scaling on his left hand is showing signs of improvement.

## 2020-05-12 NOTE — Telephone Encounter (Signed)
I had also explained to him that I will not be able to accept a transfer patient due to my high new patient volume. I will be glad to see him for acute visit only.

## 2020-05-12 NOTE — Telephone Encounter (Signed)
Caller Name: Slater Call back phone #: [phone]  Reason for Call: pt requesting to transfer to [facility] if possible. He states this was discussed with her previously. I advised that with limited providers and full panels it may not be possible. Pt states that he would like [place] to see him for scaling on left hand and CPE w/labs. Please advise if ok to schedule 1 visit for above issues.

## 2020-05-13 ENCOUNTER — Ambulatory Visit (INDEPENDENT_AMBULATORY_CARE_PROVIDER_SITE_OTHER): Payer: BC Managed Care – PPO | Admitting: Family Medicine

## 2020-05-13 ENCOUNTER — Other Ambulatory Visit: Payer: Self-pay

## 2020-05-13 DIAGNOSIS — S43101D Unspecified dislocation of right acromioclavicular joint, subsequent encounter: Secondary | ICD-10-CM | POA: Diagnosis not present

## 2020-05-13 NOTE — Progress Notes (Signed)
  Brandon [name] - 45 y.o. male MRN [id number]  Date of birth: 1975/09/18  SUBJECTIVE:  Including CC & ROS.  No chief complaint on file.   Brandon [name] is a 45 y.o. male that is following up for his right shoulder.  He had surgery in October for his AC sprain.  He has been going to physical therapy.  Has some trouble with the end range of motion movements.   Review of Systems See HPI   HISTORY: Past Medical, Surgical, Social, and Family History Reviewed & Updated per EMR.   Pertinent Historical Findings include:  Past Medical History:  Diagnosis Date  . Anxiety   . History of PSVT (paroxysmal supraventricular tachycardia)   . Lesion of vocal cord   . Palpitation   . Right knee pain     Past Surgical History:  Procedure Laterality Date  . MICROLARYNGOSCOPY Right 06/16/2019   Procedure: MICROLARYNGOSCOPY WITH EXCISION OF VOCAL CORD LESION;  Surgeon: [name], MD;  Location: [facility];  Service: ENT;  Laterality: Right;  . PATELLA FRACTURE SURGERY      Family History  Problem Relation Age of Onset  . Cancer Mother        ESOPHAGEAL  . Diabetes Father   . Heart Problems Father        CARDIOMEGALY  . Healthy Brother   . Ovarian cancer Maternal Grandmother   . Colon cancer Cousin   . Prostate cancer Neg Hx     Social History   Socioeconomic History  . Marital status: Married    Spouse name: Not on file  . Number of children: 2  . Years of education: Not on file  . Highest education level: Not on file  Occupational History  . Not on file  Tobacco Use  . Smoking status: Former Smoker    Packs/day: 0.50    Types: Cigarettes  . Smokeless tobacco: Never Used  . Tobacco comment: quit 04/2019  Vaping Use  . Vaping Use: Never used  Substance and Sexual Activity  . Alcohol use: Yes    Comment: rare  . Drug use: Yes    Types: Marijuana    Comment: rare  . Sexual activity: Not on file  Other Topics Concern  . Not on file  Social History  Narrative  . Not on file   Social Determinants of Health   Financial Resource Strain: Not on file  Food Insecurity: Not on file  Transportation Needs: Not on file  Physical Activity: Not on file  Stress: Not on file  Social Connections: Not on file  Intimate Partner Violence: Not on file     PHYSICAL EXAM:  VS: BP 116/70   Ht 6' (1.829 m)   Wt 185 lb (83.9 kg)   BMI 25.09 kg/m  Physical Exam Gen: NAD, alert, cooperative with exam, well-appearing    ASSESSMENT & PLAN:   Acromioclavicular joint separation, type 3, right, subsequent encounter Surgery was done in October.  Has been going to physical therapy.  Has some stiffness on exam today. -Continue physical therapy. -Could consider Hydro dissection around the area. -Counseled on home exercise therapy and supportive care.

## 2020-05-13 NOTE — Assessment & Plan Note (Signed)
Surgery was done in October.  Has been going to physical therapy.  Has some stiffness on exam today. -Continue physical therapy. -Could consider Hydro dissection around the area. -Counseled on home exercise therapy and supportive care.

## 2020-07-28 ENCOUNTER — Telehealth: Payer: Self-pay | Admitting: Cardiology

## 2020-07-28 NOTE — Telephone Encounter (Signed)
3.23.22 LVM on cell phone to schedule 1 yr fu w/Dr Crenshaw. LP 

## 2020-09-01 ENCOUNTER — Ambulatory Visit: Payer: BC Managed Care – PPO | Admitting: Family Medicine

## 2020-09-01 NOTE — Progress Notes (Deleted)
  Brandon [name] - 45 y.o. male MRN [id number]  Date of birth: September 02, 1975  SUBJECTIVE:  Including CC & ROS.  No chief complaint on file.   Brandon [name] is a 45 y.o. male that is  ***.  ***   Review of Systems See HPI   HISTORY: Past Medical, Surgical, Social, and Family History Reviewed & Updated per EMR.   Pertinent Historical Findings include:  Past Medical History:  Diagnosis Date  . Anxiety   . History of PSVT (paroxysmal supraventricular tachycardia)   . Lesion of vocal cord   . Palpitation   . Right knee pain     Past Surgical History:  Procedure Laterality Date  . MICROLARYNGOSCOPY Right 06/16/2019   Procedure: MICROLARYNGOSCOPY WITH EXCISION OF VOCAL CORD LESION;  Surgeon: [name], MD;  Location: [facility];  Service: ENT;  Laterality: Right;  . PATELLA FRACTURE SURGERY      Family History  Problem Relation Age of Onset  . Cancer Mother        ESOPHAGEAL  . Diabetes Father   . Heart Problems Father        CARDIOMEGALY  . Healthy Brother   . Ovarian cancer Maternal Grandmother   . Colon cancer Cousin   . Prostate cancer Neg Hx     Social History   Socioeconomic History  . Marital status: Married    Spouse name: Not on file  . Number of children: 2  . Years of education: Not on file  . Highest education level: Not on file  Occupational History  . Not on file  Tobacco Use  . Smoking status: Former Smoker    Packs/day: 0.50    Types: Cigarettes  . Smokeless tobacco: Never Used  . Tobacco comment: quit 04/2019  Vaping Use  . Vaping Use: Never used  Substance and Sexual Activity  . Alcohol use: Yes    Comment: rare  . Drug use: Yes    Types: Marijuana    Comment: rare  . Sexual activity: Not on file  Other Topics Concern  . Not on file  Social History Narrative  . Not on file   Social Determinants of Health   Financial Resource Strain: Not on file  Food Insecurity: Not on file  Transportation Needs: Not on file   Physical Activity: Not on file  Stress: Not on file  Social Connections: Not on file  Intimate Partner Violence: Not on file     PHYSICAL EXAM:  VS: There were no vitals taken for this visit. Physical Exam Gen: NAD, alert, cooperative with exam, well-appearing MSK:  ***      ASSESSMENT & PLAN:   No problem-specific Assessment & Plan notes found for this encounter.

## 2020-09-02 ENCOUNTER — Encounter: Payer: Self-pay | Admitting: Family Medicine

## 2020-09-02 ENCOUNTER — Other Ambulatory Visit: Payer: Self-pay

## 2020-09-02 ENCOUNTER — Ambulatory Visit (INDEPENDENT_AMBULATORY_CARE_PROVIDER_SITE_OTHER): Payer: BC Managed Care – PPO | Admitting: Family Medicine

## 2020-09-02 ENCOUNTER — Ambulatory Visit: Payer: Self-pay

## 2020-09-02 VITALS — BP 120/82 | Ht 72.0 in | Wt 188.0 lb

## 2020-09-02 DIAGNOSIS — M25511 Pain in right shoulder: Secondary | ICD-10-CM

## 2020-09-02 DIAGNOSIS — S43101D Unspecified dislocation of right acromioclavicular joint, subsequent encounter: Secondary | ICD-10-CM

## 2020-09-02 MED ORDER — TRIAMCINOLONE ACETONIDE 40 MG/ML IJ SUSP
40.0000 mg | Freq: Once | INTRAMUSCULAR | Status: AC
  Administered 2020-09-02: 40 mg via INTRA_ARTICULAR

## 2020-09-02 NOTE — Progress Notes (Signed)
Brandon [name] - 45 y.o. male MRN [id number]  Date of birth: May 07, 1976  SUBJECTIVE:  Including CC & ROS.  No chief complaint on file.   Brandon [name] is a 45 y.o. male that is presenting with acute on chronic right AC joint pain he had surgery a few months ago and still has ongoing pain today.  Seems worse with reaching out movements.   Review of Systems See HPI   HISTORY: Past Medical, Surgical, Social, and Family History Reviewed & Updated per EMR.   Pertinent Historical Findings include:  Past Medical History:  Diagnosis Date  . Anxiety   . History of PSVT (paroxysmal supraventricular tachycardia)   . Lesion of vocal cord   . Palpitation   . Right knee pain     Past Surgical History:  Procedure Laterality Date  . MICROLARYNGOSCOPY Right 06/16/2019   Procedure: MICROLARYNGOSCOPY WITH EXCISION OF VOCAL CORD LESION;  Surgeon: [name], MD;  Location: [facility];  Service: ENT;  Laterality: Right;  . PATELLA FRACTURE SURGERY      Family History  Problem Relation Age of Onset  . Cancer Mother        ESOPHAGEAL  . Diabetes Father   . Heart Problems Father        CARDIOMEGALY  . Healthy Brother   . Ovarian cancer Maternal Grandmother   . Colon cancer Cousin   . Prostate cancer Neg Hx     Social History   Socioeconomic History  . Marital status: Married    Spouse name: Not on file  . Number of children: 2  . Years of education: Not on file  . Highest education level: Not on file  Occupational History  . Not on file  Tobacco Use  . Smoking status: Former Smoker    Packs/day: 0.50    Types: Cigarettes  . Smokeless tobacco: Never Used  . Tobacco comment: quit 04/2019  Vaping Use  . Vaping Use: Never used  Substance and Sexual Activity  . Alcohol use: Yes    Comment: rare  . Drug use: Yes    Types: Marijuana    Comment: rare  . Sexual activity: Not on file  Other Topics Concern  . Not on file  Social History Narrative  . Not on file    Social Determinants of Health   Financial Resource Strain: Not on file  Food Insecurity: Not on file  Transportation Needs: Not on file  Physical Activity: Not on file  Stress: Not on file  Social Connections: Not on file  Intimate Partner Violence: Not on file     PHYSICAL EXAM:  VS: BP 120/82 (BP Location: Left Arm, Patient Position: Sitting, Cuff Size: Large)   Ht 6' (1.829 m)   Wt 188 lb (85.3 kg)   BMI 25.50 kg/m  Physical Exam Gen: NAD, alert, cooperative with exam, well-appearing MSK:  Right shoulder: Obvious deformity of the AC joint. Normal range of motion. Normal strength resistance. Neurovascular intact  Limited ultrasound: Right shoulder:  Normal-appearing biceps tendon. Normal-appearing subscapularis. Normal appearance with static and dynamic testing of the supraspinatus. AC joint with widening and postsurgical changes within the joint itself.  There is irregular matrix within this joint space.  Summary: Postsurgical changes of the [facility] joint.  Ultrasound and interpretation by Clare Gandy, MD   Aspiration/Injection Procedure Note Brandon [name] 11-19-1975  Procedure: Injection Indications: Right AC joint pain  Procedure Details Consent: Risks of procedure as well as the alternatives and risks of each  were explained to the (patient/caregiver).  Consent for procedure obtained. Time Out: Verified patient identification, verified procedure, site/side was marked, verified correct patient position, special equipment/implants available, medications/allergies/relevent history reviewed, required imaging and test results available.  Performed.  The area was cleaned with iodine and alcohol swabs.    The right AC joint was injected using 1 cc's of 40 mg Kenalog and 1 cc's of 0.25% bupivacaine with a 25 1 1/2" needle.  Ultrasound was used. Images were obtained in short views showing the injection.     A sterile dressing was applied.  Patient did tolerate  procedure well.    ASSESSMENT & PLAN:   Acromioclavicular joint separation, type 3, right, subsequent encounter Having post surgical pain.  Has a irregular matrix within this area that could be compressing as to why he is having pain with certain movements. -Counseled on home exercise therapy and supportive care - injection today  -Could consider barbotage or shockwave in this area.

## 2020-09-02 NOTE — Assessment & Plan Note (Addendum)
Having post surgical pain.  Has a irregular matrix within this area that could be compressing as to why he is having pain with certain movements. -Counseled on home exercise therapy and supportive care - injection today  -Could consider barbotage or shockwave in this area.

## 2020-09-02 NOTE — Patient Instructions (Signed)
Good to see you Please try ice as needed  We could consider shockwave for you knee  Please send me a message in MyChart with any questions or updates.  Please see me back in 4 weeks or as needed if better.   --Dr. Jordan Likes

## 2021-06-03 IMAGING — DX DG RIBS W/ CHEST 3+V*R*
3 series · 3 of 3 positions shown · non-contrast
Comparison: None.

CLINICAL DATA: Right rib pain, status post chiropractic
manipulation

EXAM:
RIGHT RIBS AND CHEST - 3+ VIEW

[chest pa]
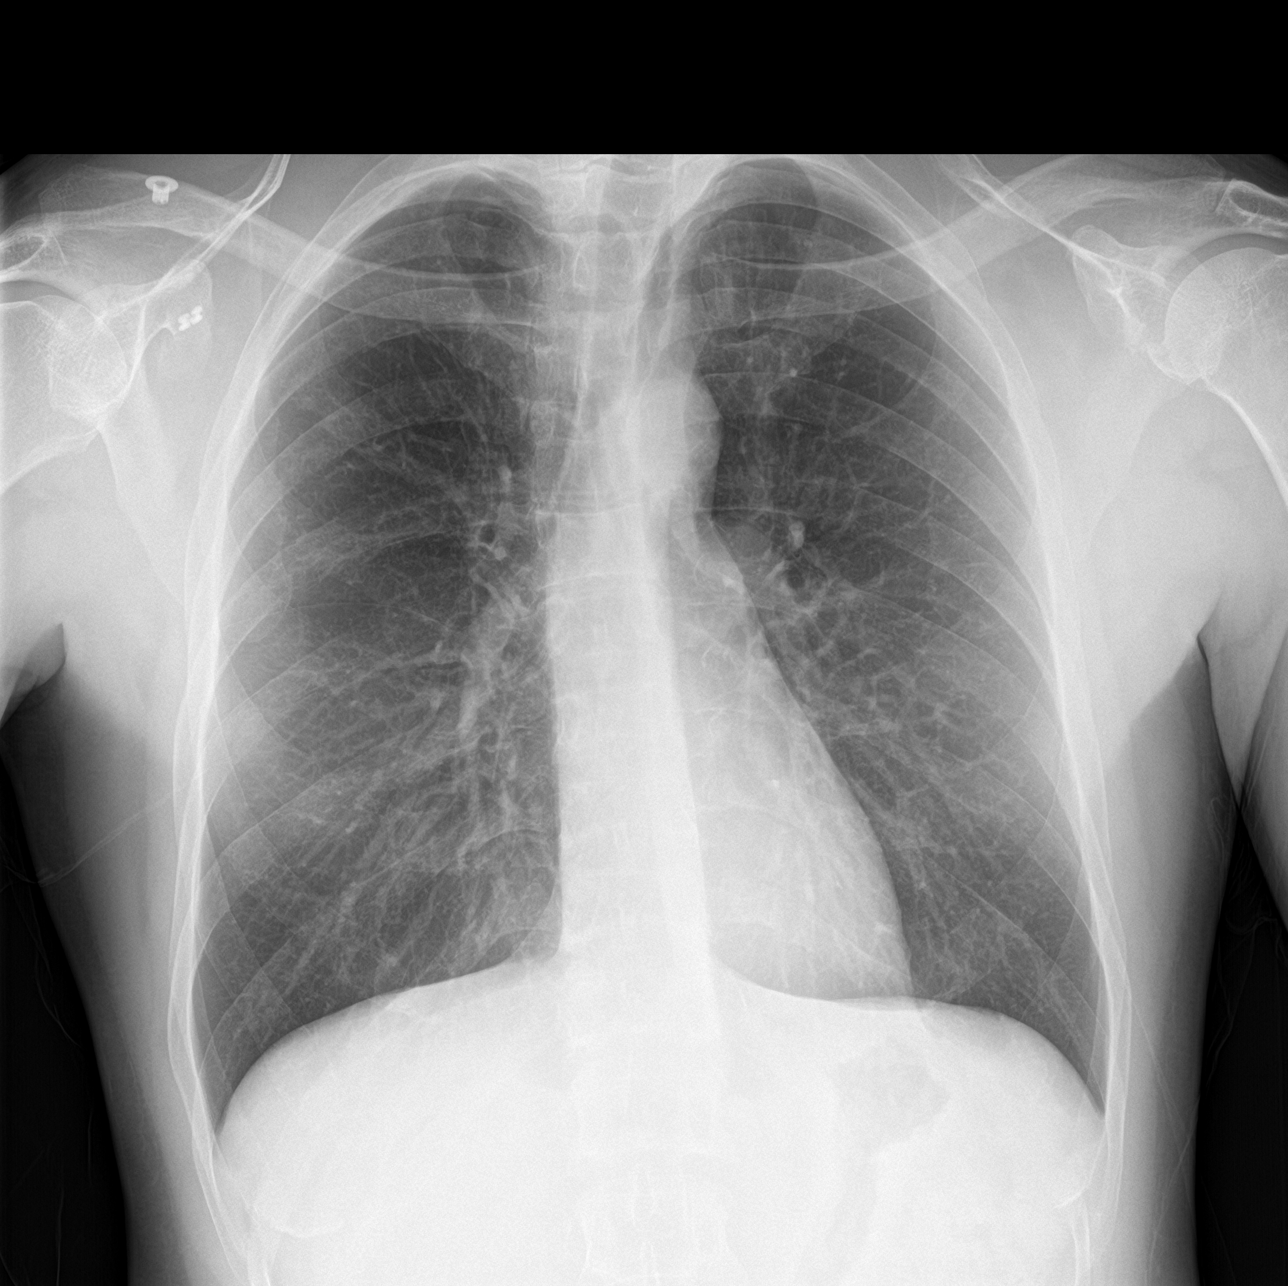

[rib pa]
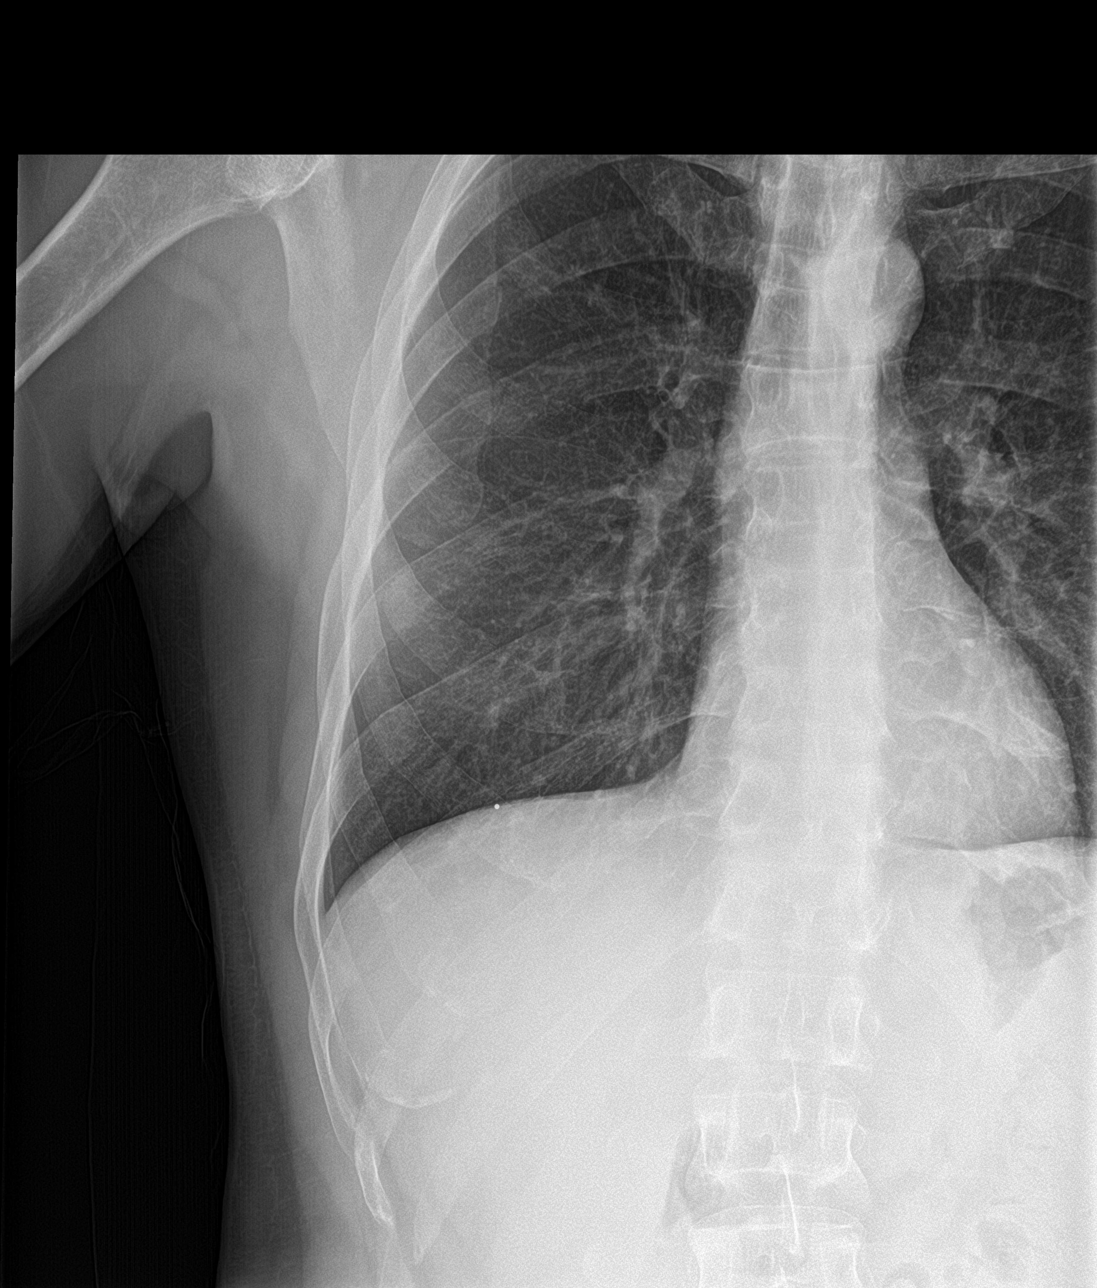

[rib pa obl]
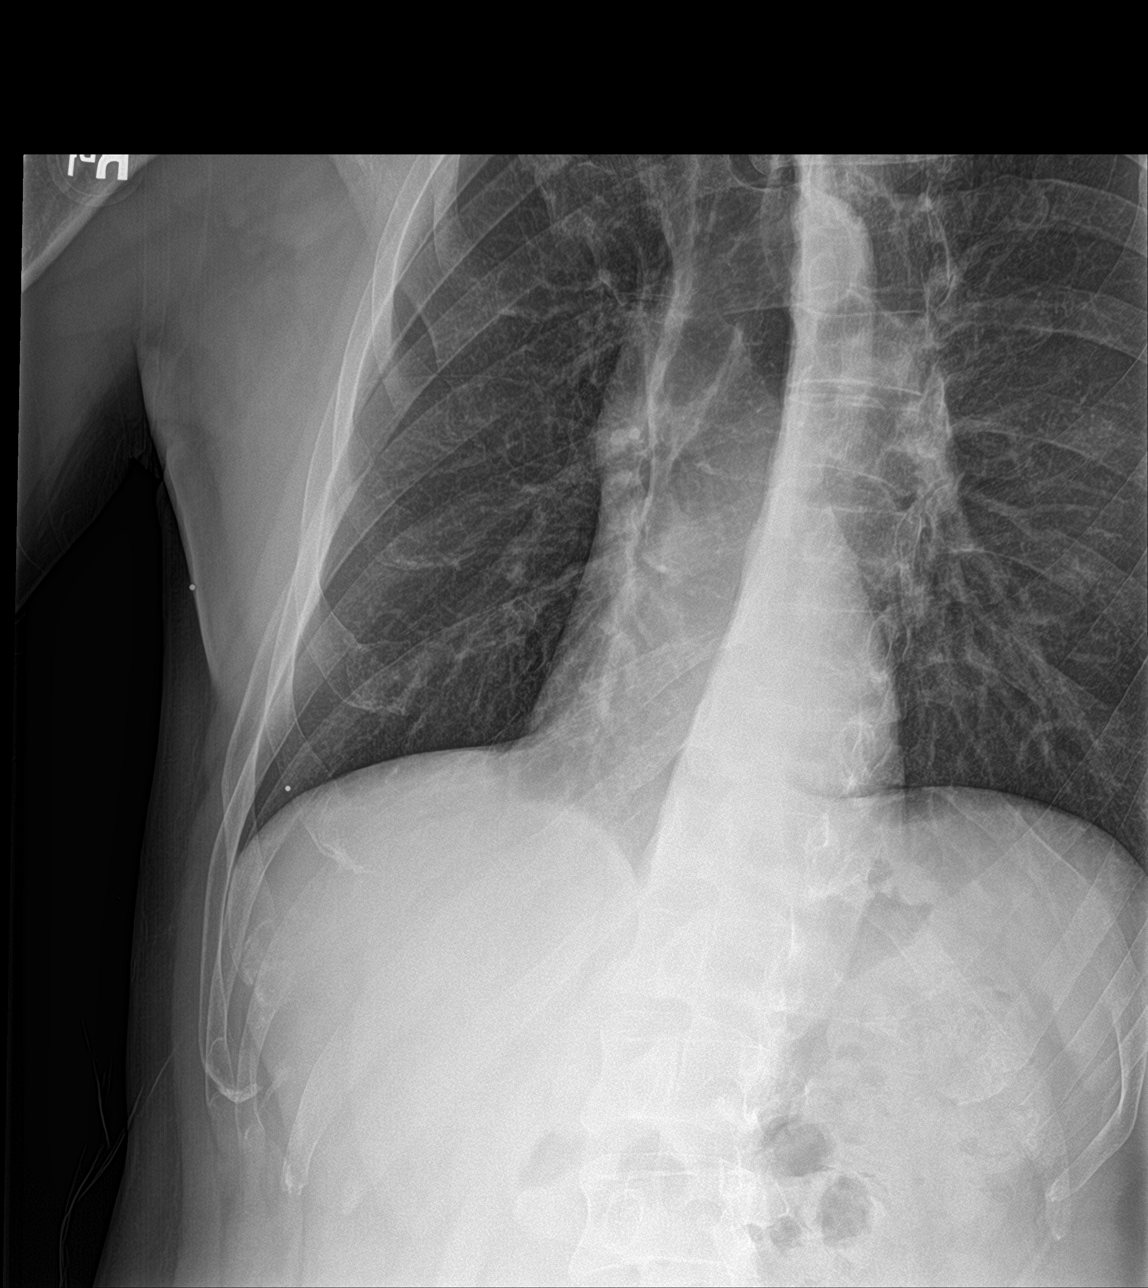

[3 of 3 positions shown; findings below may reference images not displayed]

FINDINGS: No fracture or other bone lesions are seen involving the ribs. There
is no evidence of pneumothorax or pleural effusion. Both lungs are
clear. Heart size and mediastinal contours are within normal limits.
IMPRESSION: No displaced rib fracture or other radiographic abnormality of the
right ribs to explain pain. No acute abnormality of the lungs.

## 2022-08-21 ENCOUNTER — Encounter: Payer: Self-pay | Admitting: *Deleted
